# Patient Record
Sex: Female | Born: 1985
Health system: Southern US, Community
[De-identification: ages and names within clinical notes are randomized; demographics above are authoritative.]

## PROBLEM LIST (undated history)

## (undated) DIAGNOSIS — F419 Anxiety disorder, unspecified: Secondary | ICD-10-CM

## (undated) DIAGNOSIS — F1910 Other psychoactive substance abuse, uncomplicated: Secondary | ICD-10-CM

## (undated) DIAGNOSIS — M1990 Unspecified osteoarthritis, unspecified site: Secondary | ICD-10-CM

## (undated) DIAGNOSIS — F329 Major depressive disorder, single episode, unspecified: Secondary | ICD-10-CM

## (undated) DIAGNOSIS — F32A Depression, unspecified: Secondary | ICD-10-CM

## (undated) HISTORY — DX: Other psychoactive substance abuse, uncomplicated: F19.10

## (undated) HISTORY — DX: Anxiety disorder, unspecified: F41.9

## (undated) HISTORY — DX: Unspecified osteoarthritis, unspecified site: M19.90

## (undated) HISTORY — PX: MOUTH SURGERY: SHX715

## (undated) HISTORY — DX: Depression, unspecified: F32.A

## (undated) HISTORY — DX: Major depressive disorder, single episode, unspecified: F32.9

---
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
## 2003-09-24 ENCOUNTER — Emergency Department (HOSPITAL_COMMUNITY): Admission: EM | Admit: 2003-09-24 | Discharge: 2003-09-24 | Payer: Self-pay | Admitting: Emergency Medicine

## 2003-10-04 ENCOUNTER — Encounter: Admission: RE | Admit: 2003-10-04 | Discharge: 2003-11-02 | Payer: Self-pay | Admitting: Family Medicine

## 2005-05-04 ENCOUNTER — Emergency Department (HOSPITAL_COMMUNITY): Admission: EM | Admit: 2005-05-04 | Discharge: 2005-05-04 | Payer: Self-pay | Admitting: Emergency Medicine

## 2006-12-23 ENCOUNTER — Inpatient Hospital Stay (HOSPITAL_COMMUNITY): Admission: AD | Admit: 2006-12-23 | Discharge: 2006-12-23 | Payer: Self-pay | Admitting: Obstetrics and Gynecology

## 2007-02-14 ENCOUNTER — Inpatient Hospital Stay (HOSPITAL_COMMUNITY): Admission: AD | Admit: 2007-02-14 | Discharge: 2007-02-14 | Payer: Self-pay | Admitting: Obstetrics and Gynecology

## 2007-03-21 ENCOUNTER — Inpatient Hospital Stay (HOSPITAL_COMMUNITY): Admission: RE | Admit: 2007-03-21 | Discharge: 2007-03-24 | Payer: Self-pay | Admitting: Obstetrics and Gynecology

## 2008-01-05 ENCOUNTER — Encounter: Admission: RE | Admit: 2008-01-05 | Discharge: 2008-04-04 | Payer: Self-pay | Admitting: Family Medicine

## 2010-12-19 NOTE — Op Note (Signed)
Angela Schwartz, Angela Schwartz               ACCOUNT NO.:  [id number]   MEDICAL RECORD NO.:  [id number]          PATIENT TYPE:  INP   LOCATION:  9128                          FACILITY:  [facility]   PHYSICIAN:  Zenaida Niece, M.D.DATE OF BIRTH:  [birth year]   DATE OF PROCEDURE:  03/21/2007  DATE OF DISCHARGE:                               OPERATIVE REPORT   PREOPERATIVE DIAGNOSIS:  Intrauterine pregnancy at 40 weeks and arrest  of dilation.   POSTOPERATIVE DIAGNOSIS:  Intrauterine pregnancy at 40 weeks and arrest  of dilation.   PROCEDURE:  Primary low transverse cesarean section without extensions.   SURGEON:  Zenaida Niece, M.D.   ANESTHESIA:  Epidural.   SPECIMENS:  Placenta sent to pathology.   ESTIMATED BLOOD LOSS:  800 mL.   COMPLICATIONS:  None.   FINDINGS:  The patient had normal gravid anatomy and delivered a viable  female infant with Apgar's of 9 and 9 that weighed 8 pounds, 3 ounces.   PROCEDURE IN DETAIL:  The patient was taken to the operating room and  placed in the dorsal supine position with left lateral tilt.  Her  previously placed epidural was dosed appropriately.  The abdomen was  prepped and draped in the usual sterile fashion.  The level of her  anesthesia was found to be adequate and the abdomen was entered via  standard Pfannenstiel incision.  A disposable self-retaining retractor  was placed and the lower uterine segment was well exposed.  A 4 cm  transverse incision was made in the lower uterine segment.  Once the  amniotic cavity was entered, the incision was extended bilaterally  digitally.  The fetal vertex was found to be in the direct OP position.  It was delivered through the incision atraumatically.  Mouth and nares  were suctioned and the remainder of the infant delivered atraumatically.  Cord was doubly clamped and cut, and the infant handed to the awaiting  pediatric team.  Placenta then delivered spontaneously and was sent  possibly for cord  blood collection and then to pathology.  The uterus  was wiped dry with a clean lap pad all clots and debris removed.  Uterine incision was inspected and found to be free of extensions.  Uterine incision was then closed in two layers with the first layer  being a running locking layer with #1 chromic and the second layer being  an imbricating layer also with #1 chromic.  Bleeding from serosal edges  was controlled with electrocautery and two extra figure-of-eight sutures  of #1 chromic were used for hemostasis.  Tubes and ovaries were  inspected and found to be normal.  Subfascial space was then irrigated  and made hemostatic with electrocautery.  Fascia was closed in running  fashion starting at both ends and meeting in the middle with 0 Vicryl.  Subcutaneous tissue was irrigated and made hemostatic with  electrocautery.  This was then  closed with running 2-0 plain gut suture.  Skin was closed with staples  followed by a sterile dressing.  The patient tolerated the procedure  well and was taken to the  recovery room in stable condition.  Counts  were correct and she received Ancef 1 gram IV after cord clamp.      Zenaida Niece, M.D.  Electronically Signed     TDM/MEDQ  D:  03/21/2007  T:  03/23/2007  Job:  [id number]

## 2010-12-19 NOTE — Discharge Summary (Signed)
Angela Schwartz, Angela Schwartz               ACCOUNT NO.:  [id number]   MEDICAL RECORD NO.:  [id number]          PATIENT TYPE:  INP   LOCATION:  9128                          FACILITY:  [facility]   PHYSICIAN:  Zenaida Niece, M.D.DATE OF BIRTH:  27-May-1986   DATE OF ADMISSION:  03/21/2007  DATE OF DISCHARGE:  03/24/2007                               DISCHARGE SUMMARY   ADMISSION DIAGNOSES:  1. Intrauterine pregnancy at 40+ weeks.  2. Group B strep carrier.   DISCHARGE DIAGNOSES:  1. Intrauterine pregnancy at 40+ weeks.  2. Group B strep carrier.  3. Arrest of dilation.   PROCEDURES:  On August 15, she had a primary low transverse cesarean  section.   HISTORY AND PHYSICAL:  This is a 25 year old black female gravida 2,  para 0-0-1-0 with an EGA of 40+ weeks who presents for induction with a  favorable cervix.  Prenatal care essentially uncomplicated.   PRENATAL LABS:  Blood type is O negative with a negative antibody  screen, RPR nonreactive, rubella immune, hepatitis B surface antigen  negative, HIV negative, gonorrhea and chlamydia negative, 1-hour Glucola  96, group B strep is positive.   PAST OB HISTORY:  One elective termination.   GYN HISTORY:  Remote history of chlamydia.   PAST MEDICAL HISTORY:  Of hypertension and asthma.   PHYSICAL EXAM:  VITAL SIGNS:  She is afebrile with stable vital signs.  Fetal heart tracing reactive with rare contractions.  ABDOMEN:  Gravid, nontender with an estimated fetal weight of 8 pounds.  PELVIC:  Cervix is 3+, 50, -2 vertex presentation, adequate pelvis and  amniotomy reveals clear fluid to possibly light meconium.   HOSPITAL COURSE:  The patient was admitted and had amniotomy done for  induction and was also started on penicillin for group B strep  prophylaxis.  She then required Pitocin and entered active labor.  She  had a protracted course and had an IUPC placed.  She progressed to 5-6  cm but did not make it past that despite adequate  contractions and  several hours.  Thus on the evening of August 15 she underwent a primary  low transverse cesarean section under epidural anesthesia.  She had  normal anatomy delivered a viable female infant with Apgars of nine and  nine, weight 8 pounds 3 ounces.  Estimated blood loss was 800 mL.  Postoperatively she had no significant complications.  She remained  afebrile.  Preoperative hemoglobin 12.2, postoperative 10.4.  On the  morning postoperative #3 she was felt to be stable enough for discharge  home.  Her incision was healing well and staples were to be left intact.   DISCHARGE INSTRUCTIONS:  Regular diet, pelvic rest, no strenuous  activity.  Follow-up is in 2-3 days for staple removal.  Medications are  Percocet #30 one to two p.o. q.4-6 hours p.r.n. pain and over-the-  counter counter ibuprofen as needed and she is given our discharge  pamphlet.      Zenaida Niece, M.D.  Electronically Signed     TDM/MEDQ  D:  03/24/2007  T:  03/24/2007  Job:  [id number] 

## 2011-05-18 LAB — CBC
HCT: 30 — ABNORMAL LOW
HCT: 36
Hemoglobin: 10.4 — ABNORMAL LOW
Hemoglobin: 12.2
MCHC: 33.9
MCHC: 34.5
MCV: 92.3
MCV: 93
Platelets: 207
Platelets: 245
RBC: 3.26 — ABNORMAL LOW
RBC: 3.87
RDW: 13.7
RDW: 14.2 — ABNORMAL HIGH
WBC: 12.3 — ABNORMAL HIGH
WBC: 16.1 — ABNORMAL HIGH

## 2011-05-18 LAB — RPR: RPR Ser Ql: NONREACTIVE

## 2013-05-12 ENCOUNTER — Encounter (HOSPITAL_COMMUNITY): Payer: Self-pay | Admitting: Emergency Medicine

## 2013-05-12 ENCOUNTER — Emergency Department (HOSPITAL_COMMUNITY)
Admission: EM | Admit: 2013-05-12 | Discharge: 2013-05-12 | Disposition: A | Discharge status: Home / Self Care | Payer: Self-pay | Attending: Emergency Medicine | Admitting: Emergency Medicine

## 2013-05-12 DIAGNOSIS — R220 Localized swelling, mass and lump, head: Secondary | ICD-10-CM | POA: Insufficient documentation

## 2013-05-12 DIAGNOSIS — K047 Periapical abscess without sinus: Secondary | ICD-10-CM | POA: Insufficient documentation

## 2013-05-12 DIAGNOSIS — K029 Dental caries, unspecified: Secondary | ICD-10-CM | POA: Insufficient documentation

## 2013-05-12 DIAGNOSIS — F17200 Nicotine dependence, unspecified, uncomplicated: Secondary | ICD-10-CM | POA: Insufficient documentation

## 2013-05-12 DIAGNOSIS — R51 Headache: Secondary | ICD-10-CM | POA: Insufficient documentation

## 2013-05-12 MED ORDER — PENICILLIN V POTASSIUM 500 MG PO TABS: 500.0000 mg | ORAL_TABLET | Freq: Three times a day (TID) | ORAL | Status: DC

## 2013-05-12 MED ORDER — PENICILLIN V POTASSIUM 500 MG PO TABS
500.0000 mg | ORAL_TABLET | Freq: Once | ORAL | Status: AC
  Administered 2013-05-12: 500 mg via ORAL
  Filled 2013-05-12: qty 1

## 2013-05-12 MED ORDER — IBUPROFEN 600 MG PO TABS: 600.0000 mg | ORAL_TABLET | Freq: Four times a day (QID) | ORAL | Status: DC | PRN

## 2013-05-12 MED ORDER — HYDROCODONE-ACETAMINOPHEN 5-325 MG PO TABS: ORAL_TABLET | ORAL | Status: DC

## 2013-05-12 NOTE — ED Provider Notes (Signed)
Medical screening examination/treatment/procedure(s) were performed by non-physician practitioner and as supervising physician I was immediately available for consultation/collaboration.  Maalle Starrett T Vernessa Likes, MD 05/12/13 2325 

## 2013-05-12 NOTE — ED Notes (Signed)
Pt c/o dental pain and jaw swelling x2 days.

## 2013-05-12 NOTE — ED Provider Notes (Signed)
CSN: [id number]     Arrival date & time 05/12/13  1855 History   First MD Initiated Contact with Patient 05/12/13 1926    This chart was scribed for Angela Schwartz, a non-physician practitioner working with Toy Baker, MD by Lewanda Rife, ED Scribe. This patient was seen in room WTR9/WTR9 and the patient's care was started at 7:33 PM     Chief Complaint  Patient presents with  . Dental Pain   (Consider location/radiation/quality/duration/timing/severity/associated sxs/prior Treatment) The history is provided by the patient. No language interpreter was used.   HPI Comments: Angela Schwartz is a 27 y.o. female who presents to the Emergency Department complaining of constant worsening right lower quadrant dental pain radiating to R ear onset 2 days. Reports associated mild headache, and gingival swelling. Reports symptoms are exacerbated with eating and mildly alleviated by ibuprofen and tylenol. Denies associated fever, swelling in neck, and dysphagia.   History reviewed. No pertinent past medical history. Past Surgical History  Procedure Laterality Date  . Mouth surgery    . Cesarean section     History reviewed. No pertinent family history. History  Substance Use Topics  . Smoking status: Current Every Day Smoker  . Smokeless tobacco: Not on file  . Alcohol Use: Yes   OB History   Grav Para Term Preterm Abortions TAB SAB Ect Mult Living                 Review of Systems  Constitutional: Negative for fever.  HENT: Positive for facial swelling and dental problem. Negative for ear pain, sore throat, trouble swallowing and neck pain.   Respiratory: Negative for shortness of breath and stridor.   Skin: Negative for color change.  Neurological: Negative for headaches.       Allergies  Review of patient's allergies indicates no known allergies.  Home Medications   Current Outpatient Rx  Name  Route  Sig  Dispense  Refill  . acetaminophen (TYLENOL) 500 MG  tablet   Oral   Take 500 mg by mouth every 6 (six) hours as needed for pain.         Marland Kitchen ibuprofen (ADVIL,MOTRIN) 200 MG tablet   Oral   Take 200 mg by mouth every 6 (six) hours as needed for pain.         Marland Kitchen levonorgestrel (MIRENA) 20 MCG/24HR IUD   Intrauterine   1 each by Intrauterine route once.          LMP 04/20/2013 Physical Exam  Nursing note and vitals reviewed. Constitutional: She appears well-developed and well-nourished. No distress.  HENT:  Head: Normocephalic and atraumatic.  Right Ear: Tympanic membrane, external ear and ear canal normal.  Left Ear: Tympanic membrane, external ear and ear canal normal.  Nose: Nose normal.  Mouth/Throat: Uvula is midline, oropharynx is clear and moist and mucous membranes are normal. No trismus in the jaw. Abnormal dentition. Dental caries present. No dental abscesses or edematous. No oropharyngeal exudate, posterior oropharyngeal edema, posterior oropharyngeal erythema or tonsillar abscesses.    TTP of gumline in area of right lower 1st molar, associated swelling, no gross abscess  Eyes: Conjunctivae and EOM are normal.  Neck: Normal range of motion. Neck supple. No tracheal deviation present.  No neck swelling or Ludwig's angina  Cardiovascular: Normal rate.   Pulmonary/Chest: Effort normal. No respiratory distress.  Musculoskeletal: Normal range of motion.  Lymphadenopathy:    She has no cervical adenopathy.  Neurological: She is alert.  Skin:  Skin is warm and dry.  Psychiatric: She has a normal mood and affect. Her behavior is normal.    ED Course  Procedures (including critical care time)  COORDINATION OF CARE:  Nursing notes reviewed. Vital signs reviewed. Initial pt interview and examination performed.  Treatment plan initiated: Medications  penicillin v potassium (VEETID) tablet 500 mg (500 mg Oral Given 05/12/13 1950)   Initial diagnostic testing ordered.    7:34 PM-Discussed treatment plan with pt at  bedside, which includes treatment with antibiotic and pt agrees with plan to follow up with referred dentist. Pt informed of return precautions and is comfortable with discharge at this time.   Labs Review Labs Reviewed - No data to display Imaging Review No results found.   Vital signs reviewed and are as follows: Filed Vitals:   05/12/13 1949  BP: 131/83  Pulse: 76  Temp: 98.8 F (37.1 C)  Resp: 18    Patient counseled to take prescribed medications as directed, return with worsening facial or neck swelling, and to follow-up with their dentist as soon as possible.    MDM   1. Periapical abscess    Patient with toothache.  No gross abscess that I feet could be drained at this time, but facial swelling present.  Exam unconcerning for Ludwig's angina or other deep tissue infection in neck.  Will treat with penicillin and pain medicine.  Urged patient to follow-up with dentist.     I personally performed the services described in this documentation, which was scribed in my presence. The recorded information has been reviewed and is accurate.    Renne Crigler, PA-C 05/12/13 2028

## 2017-05-30 DIAGNOSIS — H538 Other visual disturbances: Secondary | ICD-10-CM | POA: Diagnosis not present

## 2017-05-30 DIAGNOSIS — Z136 Encounter for screening for cardiovascular disorders: Secondary | ICD-10-CM | POA: Diagnosis not present

## 2017-05-30 DIAGNOSIS — E785 Hyperlipidemia, unspecified: Secondary | ICD-10-CM | POA: Diagnosis not present

## 2017-05-30 DIAGNOSIS — Z131 Encounter for screening for diabetes mellitus: Secondary | ICD-10-CM | POA: Diagnosis not present

## 2017-05-30 DIAGNOSIS — Z01118 Encounter for examination of ears and hearing with other abnormal findings: Secondary | ICD-10-CM | POA: Diagnosis not present

## 2017-05-30 DIAGNOSIS — Z0000 Encounter for general adult medical examination without abnormal findings: Secondary | ICD-10-CM | POA: Diagnosis not present

## 2017-05-30 DIAGNOSIS — E559 Vitamin D deficiency, unspecified: Secondary | ICD-10-CM | POA: Diagnosis not present

## 2017-05-30 DIAGNOSIS — Z720 Tobacco use: Secondary | ICD-10-CM | POA: Diagnosis not present

## 2017-07-03 DIAGNOSIS — E785 Hyperlipidemia, unspecified: Secondary | ICD-10-CM | POA: Diagnosis not present

## 2017-07-03 DIAGNOSIS — Z124 Encounter for screening for malignant neoplasm of cervix: Secondary | ICD-10-CM | POA: Diagnosis not present

## 2017-07-03 DIAGNOSIS — N760 Acute vaginitis: Secondary | ICD-10-CM | POA: Diagnosis not present

## 2017-07-03 DIAGNOSIS — E559 Vitamin D deficiency, unspecified: Secondary | ICD-10-CM | POA: Diagnosis not present

## 2017-07-03 DIAGNOSIS — Z720 Tobacco use: Secondary | ICD-10-CM | POA: Diagnosis not present

## 2017-07-10 DIAGNOSIS — Z720 Tobacco use: Secondary | ICD-10-CM | POA: Diagnosis not present

## 2017-07-10 DIAGNOSIS — Z124 Encounter for screening for malignant neoplasm of cervix: Secondary | ICD-10-CM | POA: Diagnosis not present

## 2017-07-10 DIAGNOSIS — E785 Hyperlipidemia, unspecified: Secondary | ICD-10-CM | POA: Diagnosis not present

## 2017-07-10 LAB — HM PAP SMEAR: HM PAP: NORMAL

## 2017-09-02 DIAGNOSIS — N898 Other specified noninflammatory disorders of vagina: Secondary | ICD-10-CM | POA: Diagnosis not present

## 2017-09-03 ENCOUNTER — Encounter (HOSPITAL_COMMUNITY): Payer: Self-pay

## 2017-09-03 ENCOUNTER — Emergency Department (HOSPITAL_COMMUNITY)
Admission: EM | Admit: 2017-09-03 | Discharge: 2017-09-03 | Disposition: A | Discharge status: Home / Self Care | Payer: No Typology Code available for payment source | Attending: Emergency Medicine | Admitting: Emergency Medicine

## 2017-09-03 ENCOUNTER — Other Ambulatory Visit: Payer: Self-pay

## 2017-09-03 DIAGNOSIS — F17210 Nicotine dependence, cigarettes, uncomplicated: Secondary | ICD-10-CM | POA: Insufficient documentation

## 2017-09-03 DIAGNOSIS — M5489 Other dorsalgia: Secondary | ICD-10-CM | POA: Diagnosis not present

## 2017-09-03 DIAGNOSIS — M546 Pain in thoracic spine: Secondary | ICD-10-CM | POA: Insufficient documentation

## 2017-09-03 DIAGNOSIS — T148XXA Other injury of unspecified body region, initial encounter: Secondary | ICD-10-CM | POA: Diagnosis not present

## 2017-09-03 MED ORDER — ACETAMINOPHEN 325 MG PO TABS
650.0000 mg | ORAL_TABLET | Freq: Once | ORAL | Status: AC
  Administered 2017-09-03: 650 mg via ORAL
  Filled 2017-09-03: qty 2

## 2017-09-03 MED ORDER — CYCLOBENZAPRINE HCL 10 MG PO TABS: 10.0000 mg | ORAL_TABLET | Freq: Two times a day (BID) | ORAL | 0 refills | Status: DC | PRN
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
## 2017-09-03 NOTE — Discharge Instructions (Addendum)
Please read instructions below.  Apply ice to your back for 20 minutes at a time. You can take advil/ibuprofen every 6 hours as needed for pain. You can take flexeril at bedtime as needed for muscle spasm. Talk with your PCP about any new medications.  Return to ER for severe headache or vision changes, if new numbness or tingling in your arms or legs, inability to urinate, inability to hold your bowels, or weakness in your extremities.
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
## 2017-09-03 NOTE — ED Notes (Signed)
Patient verbalizes understanding of discharge instructions. Opportunity for questioning and answers were provided. Armband removed by staff, pt discharged from ED ambulatory.   

## 2017-10-08 ENCOUNTER — Ambulatory Visit: Payer: Self-pay | Admitting: Nurse Practitioner

## 2017-10-15 ENCOUNTER — Ambulatory Visit: Payer: BLUE CROSS/BLUE SHIELD | Admitting: Nurse Practitioner

## 2017-10-15 ENCOUNTER — Encounter: Payer: Self-pay | Admitting: Nurse Practitioner

## 2017-10-15 VITALS — BP 134/88 | HR 87 | Temp 98.2°F | Ht 65.0 in | Wt 215.0 lb

## 2017-10-15 DIAGNOSIS — F4323 Adjustment disorder with mixed anxiety and depressed mood: Secondary | ICD-10-CM | POA: Diagnosis not present

## 2017-10-15 DIAGNOSIS — F322 Major depressive disorder, single episode, severe without psychotic features: Secondary | ICD-10-CM | POA: Insufficient documentation

## 2017-10-15 DIAGNOSIS — M25562 Pain in left knee: Secondary | ICD-10-CM | POA: Diagnosis not present

## 2017-10-15 MED ORDER — DICLOFENAC SODIUM 2 % TD SOLN: 1.0000 "application " | Freq: Two times a day (BID) | TRANSDERMAL | 0 refills | Status: DC

## 2017-10-15 NOTE — Patient Instructions (Signed)
You will be contacted to schedule appt with psychology.  Do knee exercise once a day. Call office for referral to sports medicine if no improvement in 2weeks. Use Knee sleeve daily. Remove knee sleeve during exercise. May also use cold compress as needed  Knee Pain, Adult Many things can cause knee pain. The pain often goes away on its own with time and rest. If the pain does not go away, tests may be done to find out what is causing the pain. Follow these instructions at home: Activity  Rest your knee.  Do not do things that cause pain.  Avoid activities where both feet leave the ground at the same time (high-impact activities). Examples are running, jumping rope, and doing jumping jacks. General instructions  Take medicines only as told by your doctor.  Raise (elevate) your knee when you are resting. Make sure your knee is higher than your heart.  Sleep with a pillow under your knee.  If told, put ice on the knee: ? Put ice in a plastic bag. ? Place a towel between your skin and the bag. ? Leave the ice on for 20 minutes, 2-3 times a day.  Ask your doctor if you should wear an elastic knee support.  Lose weight if you are overweight. Being overweight can make your knee hurt more.  Do not use any tobacco products. These include cigarettes, chewing tobacco, or electronic cigarettes. If you need help quitting, ask your doctor. Smoking may slow down healing. Contact a doctor if:  The pain does not stop.  The pain changes or gets worse.  You have a fever along with knee pain.  Your knee gives out or locks up.  Your knee swells, and becomes worse. Get help right away if:  Your knee feels warm.  You cannot move your knee.  You have very bad knee pain.  You have chest pain.  You have trouble breathing. Summary  Many things can cause knee pain. The pain often goes away on its own with time and rest.  Avoid activities that put stress on your knee. These include  running and jumping rope.  Get help right away if you cannot move your knee, or if your knee feels warm, or if you have trouble breathing. This information is not intended to replace advice given to you by your health care provider. Make sure you discuss any questions you have with your health care provider. Document Released: 10/19/2008 Document Revised: 07/17/2016 Document Reviewed: 07/17/2016 Elsevier Interactive Patient Education  2017 [employer]   Knee Exercises Ask your health care provider which exercises are safe for you. Do exercises exactly as told by your health care provider and adjust them as directed. It is normal to feel mild stretching, pulling, tightness, or discomfort as you do these exercises, but you should stop right away if you feel sudden pain or your pain gets worse.Do not begin these exercises until told by your health care provider. STRETCHING AND RANGE OF MOTION EXERCISES These exercises warm up your muscles and joints and improve the movement and flexibility of your knee. These exercises also help to relieve pain, numbness, and tingling. Exercise A: Knee Extension, Prone Lie on your abdomen on a bed. Place your left / right knee just beyond the edge of the surface so your knee is not on the bed. You can put a towel under your left / right thigh just above your knee for comfort. Relax your leg muscles and allow gravity to straighten  your knee. You should feel a stretch behind your left / right knee. Hold this position for __________ seconds. Scoot up so your knee is supported between repetitions. Repeat __________ times. Complete this stretch __________ times a day. Exercise B: Knee Flexion, Active  Lie on your back with both knees straight. If this causes back discomfort, bend your left / right knee so your foot is flat on the floor. Slowly slide your left / right heel back toward your buttocks until you feel a gentle stretch in the front of your knee or  thigh. Hold this position for __________ seconds. Slowly slide your left / right heel back to the starting position. Repeat __________ times. Complete this exercise __________ times a day. Exercise C: Quadriceps, Prone  Lie on your abdomen on a firm surface, such as a bed or padded floor. Bend your left / right knee and hold your ankle. If you cannot reach your ankle or pant leg, loop a belt around your foot and grab the belt instead. Gently pull your heel toward your buttocks. Your knee should not slide out to the side. You should feel a stretch in the front of your thigh and knee. Hold this position for __________ seconds. Repeat __________ times. Complete this stretch __________ times a day. Exercise D: Hamstring, Supine Lie on your back. Loop a belt or towel over the ball of your left / right foot. The ball of your foot is on the walking surface, right under your toes. Straighten your left / right knee and slowly pull on the belt to raise your leg until you feel a gentle stretch behind your knee. Do not let your left / right knee bend while you do this. Keep your other leg flat on the floor. Hold this position for __________ seconds. Repeat __________ times. Complete this stretch __________ times a day. STRENGTHENING EXERCISES These exercises build strength and endurance in your knee. Endurance is the ability to use your muscles for a long time, even after they get tired. Exercise E: Quadriceps, Isometric  Lie on your back with your left / right leg extended and your other knee bent. Put a rolled towel or small pillow under your knee if told by your health care provider. Slowly tense the muscles in the front of your left / right thigh. You should see your kneecap slide up toward your hip or see increased dimpling just above the knee. This motion will push the back of the knee toward the floor. For __________ seconds, keep the muscle as tight as you can without increasing your  pain. Relax the muscles slowly and completely. Repeat __________ times. Complete this exercise __________ times a day. Exercise F: Straight Leg Raises - Quadriceps Lie on your back with your left / right leg extended and your other knee bent. Tense the muscles in the front of your left / right thigh. You should see your kneecap slide up or see increased dimpling just above the knee. Your thigh may even shake a bit. Keep these muscles tight as you raise your leg 4-6 inches (10-15 cm) off the floor. Do not let your knee bend. Hold this position for __________ seconds. Keep these muscles tense as you lower your leg. Relax your muscles slowly and completely after each repetition. Repeat __________ times. Complete this exercise __________ times a day. Exercise G: Hamstring, Isometric Lie on your back on a firm surface. Bend your left / right knee approximately __________ degrees. Dig your left / right heel into  the surface as if you are trying to pull it toward your buttocks. Tighten the muscles in the back of your thighs to dig as hard as you can without increasing any pain. Hold this position for __________ seconds. Release the tension gradually and allow your muscles to relax completely for __________ seconds after each repetition. Repeat __________ times. Complete this exercise __________ times a day. Exercise H: Hamstring Curls  If told by your health care provider, do this exercise while wearing ankle weights. Begin with __________ weights. Then increase the weight by 1 lb (0.5 kg) increments. Do not wear ankle weights that are more than __________. Lie on your abdomen with your legs straight. Bend your left / right knee as far as you can without feeling pain. Keep your hips flat against the floor. Hold this position for __________ seconds. Slowly lower your leg to the starting position.  Repeat __________ times. Complete this exercise __________ times a day. Exercise I: Squats  (Quadriceps) Stand in front of a table, with your feet and knees pointing straight ahead. You may rest your hands on the table for balance but not for support. Slowly bend your knees and lower your hips like you are going to sit in a chair. Keep your weight over your heels, not over your toes. Keep your lower legs upright so they are parallel with the table legs. Do not let your hips go lower than your knees. Do not bend lower than told by your health care provider. If your knee pain increases, do not bend as low. Hold the squat position for __________ seconds. Slowly push with your legs to return to standing. Do not use your hands to pull yourself to standing. Repeat __________ times. Complete this exercise __________ times a day. Exercise J: Wall Slides (Quadriceps)  Lean your back against a smooth wall or door while you walk your feet out 18-24 inches (46-61 cm) from it. Place your feet hip-width apart. Slowly slide down the wall or door until your knees bend __________ degrees. Keep your knees over your heels, not over your toes. Keep your knees in line with your hips. Hold for __________ seconds. Repeat __________ times. Complete this exercise __________ times a day. Exercise K: Straight Leg Raises - Hip Abductors Lie on your side with your left / right leg in the top position. Lie so your head, shoulder, knee, and hip line up. You may bend your bottom knee to help you keep your balance. Roll your hips slightly forward so your hips are stacked directly over each other and your left / right knee is facing forward. Leading with your heel, lift your top leg 4-6 inches (10-15 cm). You should feel the muscles in your outer hip lifting. Do not let your foot drift forward. Do not let your knee roll toward the ceiling. Hold this position for __________ seconds. Slowly return your leg to the starting position. Let your muscles relax completely after each repetition. Repeat __________ times.  Complete this exercise __________ times a day. Exercise L: Straight Leg Raises - Hip Extensors Lie on your abdomen on a firm surface. You can put a pillow under your hips if that is more comfortable. Tense the muscles in your buttocks and lift your left / right leg about 4-6 inches (10-15 cm). Keep your knee straight as you lift your leg. Hold this position for __________ seconds. Slowly lower your leg to the starting position. Let your leg relax completely after each repetition. Repeat __________ times. Complete  this exercise __________ times a day. This information is not intended to replace advice given to you by your health care provider. Make sure you discuss any questions you have with your health care provider. Document Released: 06/06/2005 Document Revised: 04/16/2016 Document Reviewed: 05/29/2015 Elsevier Interactive Patient Education  2018 [employer].
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
## 2017-11-20 ENCOUNTER — Ambulatory Visit: Payer: Self-pay | Admitting: Psychology

## 2017-11-26 ENCOUNTER — Encounter: Payer: Self-pay | Admitting: Nurse Practitioner

## 2017-11-26 ENCOUNTER — Ambulatory Visit: Payer: BLUE CROSS/BLUE SHIELD | Admitting: Nurse Practitioner

## 2017-11-26 ENCOUNTER — Telehealth: Payer: Self-pay | Admitting: Nurse Practitioner

## 2017-11-26 VITALS — BP 134/84 | HR 93 | Temp 98.7°F | Ht 65.0 in | Wt 211.6 lb

## 2017-11-26 DIAGNOSIS — L209 Atopic dermatitis, unspecified: Secondary | ICD-10-CM

## 2017-11-26 DIAGNOSIS — R21 Rash and other nonspecific skin eruption: Secondary | ICD-10-CM

## 2017-11-26 MED ORDER — KETOCONAZOLE 2 % EX SHAM: 1.0000 "application " | MEDICATED_SHAMPOO | CUTANEOUS | 0 refills | Status: DC

## 2017-11-26 MED ORDER — HYDROCORTISONE ACETATE 2.5 % EX CREA: 1.0000 "application " | TOPICAL_CREAM | Freq: Two times a day (BID) | CUTANEOUS | 2 refills | Status: DC | PRN

## 2017-11-26 NOTE — Telephone Encounter (Signed)
Copied from CRM [id number]. Topic: Quick Communication - Rx Refill/Question >> Nov 26, 2017  2:58 PM Jonette EvaBarksdale, Harvey B wrote: Pharmacy called to state the medication below requires PA they would like to prescribe the regular hydrocortisone so that the pt wont have to go thru a PA contact pharmacy if needed   Medication: Hydrocortisone Acetate 2.5 % CREA [[id number]][[id number]]  Has the patient contacted their pharmacy? Yes.   (Agent: If no, request that the patient contact the pharmacy for the refill.) Preferred Pharmacy (with phone number or street name): [facility] Agent: Please be advised that RX refills may take up to 3 business days. We ask that you follow-up with your pharmacy.

## 2017-11-26 NOTE — Patient Instructions (Signed)
May use extra virgin oil to mosturize scalp 2-3times a week.  May use prenatal multivitamin 1tab once a day with food to promote hair growth.  If no improvement in 41month, will refer to dermatology.  Atopic Dermatitis Atopic dermatitis is a skin disorder that causes inflammation of the skin. This is the most common type of eczema. Eczema is a group of skin conditions that cause the skin to be itchy, red, and swollen. This condition is generally worse during the cooler winter months and often improves during the warm summer months. Symptoms can vary from person to person. Atopic dermatitis usually starts showing signs in infancy and can last through adulthood. This condition cannot be passed from one person to another (non-contagious), but it is more common in families. Atopic dermatitis may not always be present. When it is present, it is called a flare-up. What are the causes? The exact cause of this condition is not known. Flare-ups of the condition may be triggered by:  Contact with something that you are sensitive or allergic to.  Stress.  Certain foods.  Extremely hot or cold weather.  Harsh chemicals and soaps.  Dry air.  Chlorine.  What increases the risk? This condition is more likely to develop in people who have a personal history or family history of eczema, allergies, asthma, or hay fever. What are the signs or symptoms? Symptoms of this condition include:  Dry, scaly skin.  Red, itchy rash.  Itchiness, which can be severe. This may occur before the skin rash. This can make sleeping difficult.  Skin thickening and cracking that can occur over time.  How is this diagnosed? This condition is diagnosed based on your symptoms, a medical history, and a physical exam. How is this treated? There is no cure for this condition, but symptoms can usually be controlled. Treatment focuses on:  Controlling the itchiness and scratching. You may be given medicines, such as  antihistamines or steroid creams.  Limiting exposure to things that you are sensitive or allergic to (allergens).  Recognizing situations that cause stress and developing a plan to manage stress.  If your atopic dermatitis does not get better with medicines, or if it is all over your body (widespread), a treatment using a specific type of light (phototherapy) may be used. Follow these instructions at home: Skin care  Keep your skin well-moisturized. Doing this seals in moisture and helps to prevent dryness. ? Use unscented lotions that have petroleum in them. ? Avoid lotions that contain alcohol or water. They can dry the skin.  Keep baths or showers short (less than 5 minutes) in warm water. Do not use hot water. ? Use mild, unscented cleansers for bathing. Avoid soap and bubble bath. ? Apply a moisturizer to your skin right after a bath or shower.  Do not apply anything to your skin without checking with your health care provider. General instructions  Dress in clothes made of cotton or cotton blends. Dress lightly because heat increases itchiness.  When washing your clothes, rinse your clothes twice so all of the soap is removed.  Avoid any triggers that can cause a flare-up.  Try to manage your stress.  Keep your fingernails cut short.  Avoid scratching. Scratching makes the rash and itchiness worse. It may also result in a skin infection (impetigo) due to a break in the skin caused by scratching.  Take or apply over-the-counter and prescription medicines only as told by your health care provider.  Keep all follow-up visits  as told by your health care provider. This is important.  Do not be around people who have cold sores or fever blisters. If you get the infection, it may cause your atopic dermatitis to worsen. Contact a health care provider if:  Your itchiness interferes with sleep.  Your rash gets worse or it is not better within one week of starting  treatment.  You have a fever.  You have a rash flare-up after having contact with someone who has cold sores or fever blisters. Get help right away if:  You develop pus or soft yellow scabs in the rash area. Summary  This condition causes a red rash and itchy, dry, scaly skin.  Treatment focuses on controlling the itchiness and scratching, limiting exposure to things that you are sensitive or allergic to (allergens), recognizing situations that cause stress, and developing a plan to manage stress.  Keep your skin well-moisturized.  Keep baths or showers shorter than 5 minutes and use warm water. Do not use hot water. This information is not intended to replace advice given to you by your health care provider. Make sure you discuss any questions you have with your health care provider. Document Released: 07/20/2000 Document Revised: 08/24/2016 Document Reviewed: 08/24/2016 Elsevier Interactive Patient Education  [employer]2018 [employer]

## 2017-11-26 NOTE — Progress Notes (Signed)
Subjective:  Patient ID: Angela Schwartz, female    DOB: 02/09/1986  Age: 32 y.o. MRN: [id number]  CC: Rash (rash started about a month ago on back and has moved its way up her back onto her scalp, starting to loose hair now bc of it.)  Rash  This is a recurrent problem. The current episode started more than 1 month ago. The problem has been waxing and waning since onset. The affected locations include the scalp, neck and back. The rash is characterized by dryness, itchiness and scaling. She was exposed to nothing. Pertinent negatives include no anorexia, congestion, cough, facial edema, fatigue, fever, joint pain, nail changes or sore throat. Past treatments include moisturizer. The treatment provided mild relief. Her past medical history is significant for eczema.   Outpatient Medications Prior to Visit  Medication Sig Dispense Refill  . acetaminophen (TYLENOL) 500 MG tablet Take 500 mg by mouth every 6 (six) hours as needed for pain.    . Diclofenac Sodium (PENNSAID) 2 % SOLN Place 1 application onto the skin 2 (two) times daily. 112 g 0  . levonorgestrel (MIRENA) 20 MCG/24HR IUD 1 each by Intrauterine route once.     No facility-administered medications prior to visit.     ROS See HPI  Objective:  BP 134/84 (BP Location: Left Arm, Patient Position: Sitting, Cuff Size: Normal)   Pulse 93   Temp 98.7 F (37.1 C) (Oral)   Ht 5\' 5"  (1.651 m)   Wt 211 lb 9.6 oz (96 kg)   SpO2 98%   BMI 35.21 kg/m   BP Readings from Last 3 Encounters:  11/26/17 134/84  10/15/17 134/88  09/03/17 140/83    Wt Readings from Last 3 Encounters:  11/26/17 211 lb 9.6 oz (96 kg)  10/15/17 215 lb (97.5 kg)  09/03/17 207 lb (93.9 kg)    Physical Exam  Constitutional: She is oriented to person, place, and time. No distress.  HENT:  Head:    Neck: Normal range of motion. Neck supple.  Cardiovascular: Normal rate.  Pulmonary/Chest: Effort normal.  Neurological: She is alert and oriented to  person, place, and time.  Skin: Rash noted. Rash is maculopapular.     Vitals reviewed.   Lab Results  Component Value Date   WBC 16.1 (H) 03/22/2007   HGB 10.4 (L) 03/22/2007   HCT 30.0 (L) 03/22/2007   PLT 207 03/22/2007    Assessment & Plan:   Angela Schwartz was seen today for rash.  Diagnoses and all orders for this visit:  Atopic dermatitis, unspecified type -     ketoconazole (NIZORAL) 2 % shampoo; Apply 1 application topically 2 (two) times a week. To scalp -     Hydrocortisone Acetate 2.5 % CREA; Apply 1 application topically every 12 (twelve) hours as needed.   I am having Angela Schwartz start on ketoconazole and Hydrocortisone Acetate. I am also having her maintain her acetaminophen, levonorgestrel, and Diclofenac Sodium.  Meds ordered this encounter  Medications  . ketoconazole (NIZORAL) 2 % shampoo    Sig: Apply 1 application topically 2 (two) times a week. To scalp    Dispense:  120 mL    Refill:  0    Order Specific Question:   Supervising Provider    Answer:   Dianne Dun [[id number]]  . Hydrocortisone Acetate 2.5 % CREA    Sig: Apply 1 application topically every 12 (twelve) hours as needed.    Dispense:  4 g  Refill:  2    Order Specific Question:   Supervising Provider    Answer:   Dianne DunARON, TALIA M [[id number]]    Follow-up: No follow-ups on file.  [name], NP

## 2017-11-27 ENCOUNTER — Encounter: Payer: Self-pay | Admitting: Nurse Practitioner

## 2017-11-27 MED ORDER — HYDROCORTISONE 2.5 % EX CREA: TOPICAL_CREAM | Freq: Two times a day (BID) | CUTANEOUS | 0 refills | Status: DC

## 2017-11-27 NOTE — Telephone Encounter (Signed)
Needs PA for Hydrocortisone Acetate 2.5% CREAM. Pharmacy is asking for it to be changed to the regular Hydrocortisone so the patient will not have to go through a PA each time it is refilled.   [facility] [id number] [place] [facility] [place] [place] [facility] [id number] [facility] [address] [phone] (Phone) [phone] (Fax)

## 2017-11-27 NOTE — Telephone Encounter (Signed)
Your help please.Marland KitchenMarland Kitchen

## 2017-11-27 NOTE — Addendum Note (Signed)
Addended by: Alysia PennaNCHE, Orson Rho L on: 11/27/2017 02:30 PM   Modules accepted: Orders

## 2017-12-19 DIAGNOSIS — N920 Excessive and frequent menstruation with regular cycle: Secondary | ICD-10-CM | POA: Diagnosis not present

## 2017-12-19 DIAGNOSIS — N760 Acute vaginitis: Secondary | ICD-10-CM | POA: Diagnosis not present

## 2017-12-31 ENCOUNTER — Ambulatory Visit: Payer: BLUE CROSS/BLUE SHIELD | Admitting: Family Medicine

## 2017-12-31 ENCOUNTER — Encounter: Payer: Self-pay | Admitting: Family Medicine

## 2017-12-31 VITALS — BP 130/90 | HR 71 | Temp 98.2°F

## 2017-12-31 DIAGNOSIS — B029 Zoster without complications: Secondary | ICD-10-CM

## 2017-12-31 MED ORDER — VALACYCLOVIR HCL 1 G PO TABS: 1000.0000 mg | ORAL_TABLET | Freq: Three times a day (TID) | ORAL | 0 refills | Status: AC

## 2017-12-31 MED ORDER — MUPIROCIN 2 % EX OINT: 1.0000 "application " | TOPICAL_OINTMENT | Freq: Two times a day (BID) | CUTANEOUS | 0 refills | Status: DC

## 2017-12-31 NOTE — Assessment & Plan Note (Signed)
Vesicular rash consistent with early shingles Rx for valtrex tid x7 days Call for any worsening symptoms

## 2017-12-31 NOTE — Patient Instructions (Signed)

## 2017-12-31 NOTE — Progress Notes (Signed)
Angela Schwartz - 32 y.o. female MRN [id number]  Date of birth: 04-24-1986  Subjective Chief Complaint  Patient presents with  . Rash    HPI Angela Schwartz is a 32 y.o. female here today for acute visit with complaint of rash on her back.  Reports that rash began a couple of days ago, started as itching and has become more painful over the past 2 hours.  States that anything touching area is painful.  Describes as burning pain.  Daughter noticed some drainage from area this morning.  She had similar symptoms several months ago that resolved spontaneously.  She denies fever, chills, other lesions/rash.    ROS: Completed and negative except as noted in HPI  No Known Allergies  Past Medical History:  Diagnosis Date  . Anxiety   . Arthritis   . Depression   . Substance abuse [facility])     Past Surgical History:  Procedure Laterality Date  . CESAREAN SECTION    . MOUTH SURGERY      Social History   Socioeconomic History  . Marital status: Single    Spouse name: Not on file  . Number of children: 1  . Years of education: Not on file  . Highest education level: Not on file  Occupational History  . 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  Social Needs  . Financial resource strain: Not on file  . Food insecurity:    Worry: Not on file    Inability: Not on file  . Transportation needs:    Medical: Not on file    Non-medical: Not on file  Tobacco Use  . Smoking status: Former Games developer  . Smokeless tobacco: Never Used  Substance and Sexual Activity  . Alcohol use: Yes    Comment: every weekend  . Drug use: Yes    Types: Marijuana    Comment: once in awhile  . Sexual activity: Yes    Birth control/protection: IUD    Comment: Mirena- inserted 2016  Lifestyle  . Physical activity:    Days per week: Not on file    Minutes per session: Not on file  . Stress: Not on file  Relationships  . Social connections:    Talks on phone: Not on file    Gets together: Not  on file    Attends religious service: Not on file    Active member of club or organization: Not on file    Attends meetings of clubs or organizations: Not on file    Relationship status: Not on file  Other Topics Concern  . Not on file  Social History Narrative  . Not on file    Family History  Problem Relation Age of Onset  . Hypertension Mother   . Hyperlipidemia Mother   . Hypertension Father   . Hyperlipidemia Father   . Heart failure Father   . Diabetes Father   . Hypertension Brother   . Cancer Maternal Aunt        cervical    Health Maintenance  Topic Date Due  . PAP SMEAR  02/22/2005  . INFLUENZA VACCINE  06/17/2018 (Originally 03/06/2018)  . HIV Screening  11/27/2018 (Originally 02/23/1999)  . TETANUS/TDAP  08/21/2023    ----------------------------------------------------------------------------------------------------------------------------------------------------------------------------------------------------------------- Physical Exam BP 130/90   Pulse 71   Temp 98.2 F (36.8 C)   Physical Exam  Constitutional: She is oriented to person, place, and time. She appears well-nourished. No distress.  HENT:  Head: Normocephalic and atraumatic.  Mouth/Throat: Oropharynx is clear and moist.  Eyes: No scleral icterus.  Cardiovascular: Normal rate, regular rhythm and normal heart sounds.  Neurological: She is alert and oriented to person, place, and time.  Skin:  Slightly indurated cluster of vesicles on upper right side of back.  Small amount of crusting but no drainage or fluctuance  Psychiatric: She has a normal mood and affect. Her behavior is normal.    ------------------------------------------------------------------------------------------------------------------------------------------------------------------------------------------------------------------- Assessment and Plan  Herpes zoster without complication Vesicular rash consistent with early  shingles Rx for valtrex tid x7 days Call for any worsening symptoms

## 2018-01-01 ENCOUNTER — Encounter: Payer: Self-pay | Admitting: Emergency Medicine

## 2018-01-15 ENCOUNTER — Ambulatory Visit: Payer: BLUE CROSS/BLUE SHIELD | Admitting: Nurse Practitioner

## 2018-02-04 ENCOUNTER — Encounter: Payer: Self-pay | Admitting: Nurse Practitioner

## 2018-02-04 ENCOUNTER — Ambulatory Visit: Payer: BLUE CROSS/BLUE SHIELD | Admitting: Nurse Practitioner

## 2018-02-04 VITALS — BP 134/84 | HR 88 | Temp 98.5°F | Ht 65.0 in | Wt 212.0 lb

## 2018-02-04 DIAGNOSIS — L209 Atopic dermatitis, unspecified: Secondary | ICD-10-CM

## 2018-02-04 DIAGNOSIS — F4323 Adjustment disorder with mixed anxiety and depressed mood: Secondary | ICD-10-CM

## 2018-02-04 MED ORDER — KETOCONAZOLE 2 % EX SHAM: 1.0000 "application " | MEDICATED_SHAMPOO | CUTANEOUS | 0 refills | Status: DC

## 2018-02-04 MED ORDER — HYDROCORTISONE 2.5 % EX CREA: TOPICAL_CREAM | Freq: Two times a day (BID) | CUTANEOUS | 0 refills | Status: DC

## 2018-02-04 NOTE — Patient Instructions (Addendum)
Please contact psychology to reschedule appt.  You may also reach out to another psychology on list provided if you are not able to get an appt with [facility] psychology.  Encourage staying active and heart healthy diet.  Living With Depression Everyone experiences occasional disappointment, sadness, and loss in their lives. When you are feeling down, blue, or sad for at least 2 weeks in a row, it may mean that you have depression. Depression can affect your thoughts and feelings, relationships, daily activities, and physical health. It is caused by changes in the way your brain functions. If you receive a diagnosis of depression, your health care provider will tell you which type of depression you have and what treatment options are available to you. If you are living with depression, there are ways to help you recover from it and also ways to prevent it from coming back. How to cope with lifestyle changes Coping with stress Stress is your body's reaction to life changes and events, both good and bad. Stressful situations may include:  Getting married.  The death of a spouse.  Losing a job.  Retiring.  Having a baby.  Stress can last just a few hours or it can be ongoing. Stress can play a major role in depression, so it is important to learn both how to cope with stress and how to think about it differently. Talk with your health care provider or a counselor if you would like to learn more about stress reduction. He or she may suggest some stress reduction techniques, such as:  Music therapy. This can include creating music or listening to music. Choose music that you enjoy and that inspires you.  Mindfulness-based meditation. This kind of meditation can be done while sitting or walking. It involves being aware of your normal breaths, rather than trying to control your breathing.  Centering prayer. This is a kind of meditation that involves focusing on a spiritual word or phrase. Choose a  word, phrase, or sacred image that is meaningful to you and that brings you peace.  Deep breathing. To do this, expand your stomach and inhale slowly through your nose. Hold your breath for 3-5 seconds, then exhale slowly, allowing your stomach muscles to relax.  Muscle relaxation. This involves intentionally tensing muscles then relaxing them.  Choose a stress reduction technique that fits your lifestyle and personality. Stress reduction techniques take time and practice to develop. Set aside 5-15 minutes a day to do them. Therapists can offer training in these techniques. The training may be covered by some insurance plans. Other things you can do to manage stress include:  Keeping a stress diary. This can help you learn what triggers your stress and ways to control your response.  Understanding what your limits are and saying no to requests or events that lead to a schedule that is too full.  Thinking about how you respond to certain situations. You may not be able to control everything, but you can control how you react.  Adding humor to your life by watching funny films or TV shows.  Making time for activities that help you relax and not feeling guilty about spending your time this way.  Medicines Your health care provider may suggest certain medicines if he or she feels that they will help improve your condition. Avoid using alcohol and other substances that may prevent your medicines from working properly (may interact). It is also important to:  Talk with your pharmacist or health care provider about  all the medicines that you take, their possible side effects, and what medicines are safe to take together.  Make it your goal to take part in all treatment decisions (shared decision-making). This includes giving input on the side effects of medicines. It is best if shared decision-making with your health care provider is part of your total treatment plan.  If your health care provider  prescribes a medicine, you may not notice the full benefits of it for 4-8 weeks. Most people who are treated for depression need to be on medicine for at least 6-12 months after they feel better. If you are taking medicines as part of your treatment, do not stop taking medicines without first talking to your health care provider. You may need to have the medicine slowly decreased (tapered) over time to decrease the risk of harmful side effects. Relationships Your health care provider may suggest family therapy along with individual therapy and drug therapy. While there may not be family problems that are causing you to feel depressed, it is still important to make sure your family learns as much as they can about your mental health. Having your family's support can help make your treatment successful. How to recognize changes in your condition Everyone has a different response to treatment for depression. Recovery from major depression happens when you have not had signs of major depression for two months. This may mean that you will start to:  Have more interest in doing activities.  Feel less hopeless than you did 2 months ago.  Have more energy.  Overeat less often, or have better or improving appetite.  Have better concentration.  Your health care provider will work with you to decide the next steps in your recovery. It is also important to recognize when your condition is getting worse. Watch for these signs:  Having fatigue or low energy.  Eating too much or too little.  Sleeping too much or too little.  Feeling restless, agitated, or hopeless.  Having trouble concentrating or making decisions.  Having unexplained physical complaints.  Feeling irritable, angry, or aggressive.  Get help as soon as you or your family members notice these symptoms coming back. How to get support and help from others How to talk with friends and family members about your condition Talking to  friends and family members about your condition can provide you with one way to get support and guidance. Reach out to trusted friends or family members, explain your symptoms to them, and let them know that you are working with a health care provider to treat your depression. Financial resources Not all insurance plans cover mental health care, so it is important to check with your insurance carrier. If paying for co-pays or counseling services is a problem, search for a local or county mental health care center. They may be able to offer public mental health care services at low or no cost when you are not able to see a private health care provider. If you are taking medicine for depression, you may be able to get the generic form, which may be less expensive. Some makers of prescription medicines also offer help to patients who cannot afford the medicines they need. Follow these instructions at home:  Get the right amount and quality of sleep.  Cut down on using caffeine, tobacco, alcohol, and other potentially harmful substances.  Try to exercise, such as walking or lifting small weights.  Take over-the-counter and prescription medicines only as told by your health  care provider.  Eat a healthy diet that includes plenty of vegetables, fruits, whole grains, low-fat dairy products, and lean protein. Do not eat a lot of foods that are high in solid fats, added sugars, or salt.  Keep all follow-up visits as told by your health care provider. This is important. Contact a health care provider if:  You stop taking your antidepressant medicines, and you have any of these symptoms: ? Nausea. ? Headache. ? Feeling lightheaded. ? Chills and body aches. ? Not being able to sleep (insomnia).  You or your friends and family think your depression is getting worse. Get help right away if:  You have thoughts of hurting yourself or others. If you ever feel like you may hurt yourself or others, or  have thoughts about taking your own life, get help right away. You can go to your nearest emergency department or call:  Your local emergency services (911 in the [place]  A suicide crisis helpline, such as the National Suicide Prevention Lifeline at 401 419 5275. This is open 24-hours a day.  Summary  If you are living with depression, there are ways to help you recover from it and also ways to prevent it from coming back.  Work with your health care team to create a management plan that includes counseling, stress management techniques, and healthy lifestyle habits. This information is not intended to replace advice given to you by your health care provider. Make sure you discuss any questions you have with your health care provider. Document Released: 06/25/2016 Document Revised: 06/25/2016 Document Reviewed: 06/25/2016 Elsevier Interactive Patient Education  [employer].

## 2018-02-04 NOTE — Progress Notes (Signed)
Subjective:  Patient ID: Angela Schwartz, female    DOB: 1985-12-05  Age: 32 y.o. MRN: [id number]  CC: Follow-up (3 mo fu--anxiety and depression same/ refill on nizoral?) and Rash (rash back of the right knee)  Rash  This is a new problem. The current episode started in the past 7 days. The problem is unchanged. The affected locations include the right lower leg (behind right knee). The rash is characterized by redness and itchiness. Associated with: onset after wear knee brace. Pertinent negatives include no fatigue, fever or joint pain. Past treatments include nothing. Her past medical history is significant for allergies and eczema.    Ms. Angela Schwartz is here for anxiety and depression f/up. States she missed her appt with psychology. Reports no change in mood. She copes by avoiding contact with public.  Reviewed [facility] today  Outpatient Medications Prior to Visit  Medication Sig Dispense Refill  . acetaminophen (TYLENOL) 500 MG tablet Take 500 mg by mouth every 6 (six) hours as needed for pain.    . Diclofenac Sodium (PENNSAID) 2 % SOLN Place 1 application onto the skin 2 (two) times daily. 112 g 0  . levonorgestrel (MIRENA) 20 MCG/24HR IUD 1 each by Intrauterine route once.    . mupirocin ointment (BACTROBAN) 2 % Apply 1 application topically 2 (two) times daily. 22 g 0  . ketoconazole (NIZORAL) 2 % shampoo Apply 1 application topically 2 (two) times a week. To scalp 120 mL 0  . hydrocortisone 2.5 % cream Apply topically 2 (two) times daily. (Patient not taking: Reported on 02/04/2018) 30 g 0   No facility-administered medications prior to visit.     ROS See HPI  Objective:  BP 134/84   Pulse 88   Temp 98.5 F (36.9 C) (Oral)   Ht 5\' 5"  (1.651 m)   Wt 212 lb (96.2 kg)   SpO2 96%   BMI 35.28 kg/m   BP Readings from Last 3 Encounters:  02/04/18 134/84  12/31/17 130/90  11/26/17 134/84    Wt Readings from Last 3 Encounters:  02/04/18 212 lb (96.2 kg)  11/26/17 211 lb  9.6 oz (96 kg)  10/15/17 215 lb (97.5 kg)    Physical Exam  Constitutional: She is oriented to person, place, and time. No distress.  Cardiovascular: Normal rate.  Pulmonary/Chest: Effort normal.  Neurological: She is alert and oriented to person, place, and time.  Skin: Skin is warm and dry. Rash noted. Rash is maculopapular.     Psychiatric: She has a normal mood and affect. Her behavior is normal. Thought content normal.  Vitals reviewed.   Lab Results  Component Value Date   WBC 16.1 (H) 03/22/2007   HGB 10.4 (L) 03/22/2007   HCT 30.0 (L) 03/22/2007   PLT 207 03/22/2007    Assessment & Plan:   Victorino DikeJennifer was seen today for follow-up and rash.  Diagnoses and all orders for this visit:  Adjustment disorder with mixed anxiety and depressed mood  Atopic dermatitis, unspecified type -     ketoconazole (NIZORAL) 2 % shampoo; Apply 1 application topically 2 (two) times a week. To scalp -     hydrocortisone 2.5 % cream; Apply topically 2 (two) times daily.   I am having Rhea BleacherJennifer N. Hayse maintain her acetaminophen, levonorgestrel, Diclofenac Sodium, mupirocin ointment, ketoconazole, and hydrocortisone.  Meds ordered this encounter  Medications  . ketoconazole (NIZORAL) 2 % shampoo    Sig: Apply 1 application topically 2 (two) times a week. To scalp  Dispense:  120 mL    Refill:  0    Order Specific Question:   Supervising Provider    Answer:   Dianne Dun [[id number]]  . hydrocortisone 2.5 % cream    Sig: Apply topically 2 (two) times daily.    Dispense:  30 g    Refill:  0    Order Specific Question:   Supervising Provider    Answer:   Dianne Dun [[id number]]    Follow-up: Return in about 5 months (around 07/07/2018) for CPE (fasting).  Alysia Penna, NP

## 2018-03-06 ENCOUNTER — Encounter: Payer: Self-pay | Admitting: Nurse Practitioner

## 2018-03-06 NOTE — Progress Notes (Signed)
Abstract and sent to be scanned 

## 2018-03-21 DIAGNOSIS — R300 Dysuria: Secondary | ICD-10-CM | POA: Diagnosis not present

## 2018-03-21 DIAGNOSIS — N760 Acute vaginitis: Secondary | ICD-10-CM | POA: Diagnosis not present

## 2018-06-02 DIAGNOSIS — N949 Unspecified condition associated with female genital organs and menstrual cycle: Secondary | ICD-10-CM | POA: Diagnosis not present

## 2018-06-02 DIAGNOSIS — N898 Other specified noninflammatory disorders of vagina: Secondary | ICD-10-CM | POA: Diagnosis not present

## 2018-06-02 DIAGNOSIS — N760 Acute vaginitis: Secondary | ICD-10-CM | POA: Diagnosis not present

## 2018-06-11 ENCOUNTER — Ambulatory Visit: Payer: BLUE CROSS/BLUE SHIELD | Admitting: Nurse Practitioner

## 2018-06-17 ENCOUNTER — Ambulatory Visit: Payer: BLUE CROSS/BLUE SHIELD | Admitting: Nurse Practitioner

## 2018-06-17 DIAGNOSIS — Z0289 Encounter for other administrative examinations: Secondary | ICD-10-CM

## 2018-07-07 ENCOUNTER — Encounter: Payer: Self-pay | Admitting: Nurse Practitioner

## 2018-07-09 ENCOUNTER — Ambulatory Visit (INDEPENDENT_AMBULATORY_CARE_PROVIDER_SITE_OTHER): Payer: BLUE CROSS/BLUE SHIELD | Admitting: Nurse Practitioner

## 2018-07-09 ENCOUNTER — Encounter: Payer: Self-pay | Admitting: Nurse Practitioner

## 2018-07-09 VITALS — BP 122/84 | HR 91 | Temp 99.0°F | Ht 65.0 in | Wt 217.8 lb

## 2018-07-09 DIAGNOSIS — F322 Major depressive disorder, single episode, severe without psychotic features: Secondary | ICD-10-CM | POA: Diagnosis not present

## 2018-07-09 DIAGNOSIS — Z0001 Encounter for general adult medical examination with abnormal findings: Secondary | ICD-10-CM | POA: Diagnosis not present

## 2018-07-09 DIAGNOSIS — Z136 Encounter for screening for cardiovascular disorders: Secondary | ICD-10-CM | POA: Diagnosis not present

## 2018-07-09 DIAGNOSIS — Z13220 Encounter for screening for lipoid disorders: Secondary | ICD-10-CM

## 2018-07-09 DIAGNOSIS — Z8619 Personal history of other infectious and parasitic diseases: Secondary | ICD-10-CM | POA: Insufficient documentation

## 2018-07-09 LAB — COMPREHENSIVE METABOLIC PANEL
ALT: 13 U/L (ref 0–35)
AST: 25 U/L (ref 0–37)
Albumin: 4.2 g/dL (ref 3.5–5.2)
Alkaline Phosphatase: 53 U/L (ref 39–117)
BUN: 11 mg/dL (ref 6–23)
CHLORIDE: 110 meq/L (ref 96–112)
CO2: 20 meq/L (ref 19–32)
Calcium: 9.1 mg/dL (ref 8.4–10.5)
Creatinine, Ser: 0.85 mg/dL (ref 0.40–1.20)
GFR: 98.24 mL/min (ref >60.00)
GLUCOSE: 93 mg/dL (ref 70–99)
SODIUM: 140 meq/L (ref 135–145)
Total Bilirubin: 0.7 mg/dL (ref 0.2–1.2)
Total Protein: 7.3 g/dL (ref 6.0–8.3)

## 2018-07-09 LAB — LIPID PANEL
Cholesterol: 154 mg/dL (ref 0–200)
HDL: 59.1 mg/dL (ref >39.00)
LDL CALC: 82 mg/dL (ref 0–99)
NONHDL: 94.77
Total CHOL/HDL Ratio: 3
Triglycerides: 65 mg/dL (ref 0.0–149.0)
VLDL: 13 mg/dL (ref 0.0–40.0)

## 2018-07-09 LAB — CBC
HEMATOCRIT: 40.5 % (ref 36.0–46.0)
Hemoglobin: 14 g/dL (ref 12.0–15.0)
MCHC: 34.5 g/dL (ref 30.0–36.0)
MCV: 98.3 fl (ref 78.0–100.0)
PLATELETS: 245 10*3/uL (ref 150.0–400.0)
RBC: 4.12 Mil/uL (ref 3.87–5.11)
RDW: 13.4 % (ref 11.5–15.5)
WBC: 8 10*3/uL (ref 4.0–10.5)

## 2018-07-09 LAB — TSH: TSH: 0.7 u[IU]/mL (ref 0.35–4.50)

## 2018-07-09 MED ORDER — BUPROPION HCL ER (XL) 150 MG PO TB24: 150.0000 mg | ORAL_TABLET | Freq: Every day | ORAL | 1 refills | Status: DC

## 2018-07-09 NOTE — Patient Instructions (Signed)
Go to lab for blood draw,  Start wellbutrin today. You will be contacted to schedule appt with psychology. Encourage to start regular exercise regimen.  Living With Depression Everyone experiences occasional disappointment, sadness, and loss in their lives. When you are feeling down, blue, or sad for at least 2 weeks in a row, it may mean that you have depression. Depression can affect your thoughts and feelings, relationships, daily activities, and physical health. It is caused by changes in the way your brain functions. If you receive a diagnosis of depression, your health care provider will tell you which type of depression you have and what treatment options are available to you. If you are living with depression, there are ways to help you recover from it and also ways to prevent it from coming back. How to cope with lifestyle changes Coping with stress Stress is your body's reaction to life changes and events, both good and bad. Stressful situations may include:  Getting married.  The death of a spouse.  Losing a job.  Retiring.  Having a baby.  Stress can last just a few hours or it can be ongoing. Stress can play a major role in depression, so it is important to learn both how to cope with stress and how to think about it differently. Talk with your health care provider or a counselor if you would like to learn more about stress reduction. He or she may suggest some stress reduction techniques, such as:  Music therapy. This can include creating music or listening to music. Choose music that you enjoy and that inspires you.  Mindfulness-based meditation. This kind of meditation can be done while sitting or walking. It involves being aware of your normal breaths, rather than trying to control your breathing.  Centering prayer. This is a kind of meditation that involves focusing on a spiritual word or phrase. Choose a word, phrase, or sacred image that is meaningful to you and that  brings you peace.  Deep breathing. To do this, expand your stomach and inhale slowly through your nose. Hold your breath for 3-5 seconds, then exhale slowly, allowing your stomach muscles to relax.  Muscle relaxation. This involves intentionally tensing muscles then relaxing them.  Choose a stress reduction technique that fits your lifestyle and personality. Stress reduction techniques take time and practice to develop. Set aside 5-15 minutes a day to do them. Therapists can offer training in these techniques. The training may be covered by some insurance plans. Other things you can do to manage stress include:  Keeping a stress diary. This can help you learn what triggers your stress and ways to control your response.  Understanding what your limits are and saying no to requests or events that lead to a schedule that is too full.  Thinking about how you respond to certain situations. You may not be able to control everything, but you can control how you react.  Adding humor to your life by watching funny films or TV shows.  Making time for activities that help you relax and not feeling guilty about spending your time this way.  Medicines Your health care provider may suggest certain medicines if he or she feels that they will help improve your condition. Avoid using alcohol and other substances that may prevent your medicines from working properly (may interact). It is also important to:  Talk with your pharmacist or health care provider about all the medicines that you take, their possible side effects, and what medicines  are safe to take together.  Make it your goal to take part in all treatment decisions (shared decision-making). This includes giving input on the side effects of medicines. It is best if shared decision-making with your health care provider is part of your total treatment plan.  If your health care provider prescribes a medicine, you may not notice the full benefits of it  for 4-8 weeks. Most people who are treated for depression need to be on medicine for at least 6-12 months after they feel better. If you are taking medicines as part of your treatment, do not stop taking medicines without first talking to your health care provider. You may need to have the medicine slowly decreased (tapered) over time to decrease the risk of harmful side effects. Relationships Your health care provider may suggest family therapy along with individual therapy and drug therapy. While there may not be family problems that are causing you to feel depressed, it is still important to make sure your family learns as much as they can about your mental health. Having your family's support can help make your treatment successful. How to recognize changes in your condition Everyone has a different response to treatment for depression. Recovery from major depression happens when you have not had signs of major depression for two months. This may mean that you will start to:  Have more interest in doing activities.  Feel less hopeless than you did 2 months ago.  Have more energy.  Overeat less often, or have better or improving appetite.  Have better concentration.  Your health care provider will work with you to decide the next steps in your recovery. It is also important to recognize when your condition is getting worse. Watch for these signs:  Having fatigue or low energy.  Eating too much or too little.  Sleeping too much or too little.  Feeling restless, agitated, or hopeless.  Having trouble concentrating or making decisions.  Having unexplained physical complaints.  Feeling irritable, angry, or aggressive.  Get help as soon as you or your family members notice these symptoms coming back. How to get support and help from others How to talk with friends and family members about your condition Talking to friends and family members about your condition can provide you with  one way to get support and guidance. Reach out to trusted friends or family members, explain your symptoms to them, and let them know that you are working with a health care provider to treat your depression. Financial resources Not all insurance plans cover mental health care, so it is important to check with your insurance carrier. If paying for co-pays or counseling services is a problem, search for a local or county mental health care center. They may be able to offer public mental health care services at low or no cost when you are not able to see a private health care provider. If you are taking medicine for depression, you may be able to get the generic form, which may be less expensive. Some makers of prescription medicines also offer help to patients who cannot afford the medicines they need. Follow these instructions at home:  Get the right amount and quality of sleep.  Cut down on using caffeine, tobacco, alcohol, and other potentially harmful substances.  Try to exercise, such as walking or lifting small weights.  Take over-the-counter and prescription medicines only as told by your health care provider.  Eat a healthy diet that includes plenty of vegetables, fruits,  whole grains, low-fat dairy products, and lean protein. Do not eat a lot of foods that are high in solid fats, added sugars, or salt.  Keep all follow-up visits as told by your health care provider. This is important. Contact a health care provider if:  You stop taking your antidepressant medicines, and you have any of these symptoms: ? Nausea. ? Headache. ? Feeling lightheaded. ? Chills and body aches. ? Not being able to sleep (insomnia).  You or your friends and family think your depression is getting worse. Get help right away if:  You have thoughts of hurting yourself or others. If you ever feel like you may hurt yourself or others, or have thoughts about taking your own life, get help right away. You can  go to your nearest emergency department or call:  Your local emergency services (911 in the [place]  A suicide crisis helpline, such as the National Suicide Prevention Lifeline at 2367914932. This is open 24-hours a day.  Summary  If you are living with depression, there are ways to help you recover from it and also ways to prevent it from coming back.  Work with your health care team to create a management plan that includes counseling, stress management techniques, and healthy lifestyle habits. This information is not intended to replace advice given to you by your health care provider. Make sure you discuss any questions you have with your health care provider. Document Released: 06/25/2016 Document Revised: 06/25/2016 Document Reviewed: 06/25/2016 Elsevier Interactive Patient Education  [employer].

## 2018-07-09 NOTE — Assessment & Plan Note (Signed)
Used prozac and lexapro in past (ineffective) Start wellbutrin Entered referral for psychologist

## 2018-07-09 NOTE — Progress Notes (Signed)
Subjective:    Patient ID: Angela Schwartz, female    DOB: 10/25/1985, 32 y.o.   MRN: [id number]  Patient presents today for complete physical and management of anxiety and depression  HPI  Sexual History (orientation,birth control, marital status, STD):sexually active, mirena present, last PAP: normal with negative HPV  Depression/Suicide:Lexapro(not effective) and prozac used in past. Will like to start medication and another referssal to counselor. Depression screen [facility] 2/9 07/09/2018 02/04/2018 10/15/2017 10/15/2017  Decreased Interest 3 2 2 1   Down, Depressed, Hopeless 3 2 2 1   PHQ - 2 Score 6 4 4 2   Altered sleeping 3 2 3  -  Tired, decreased energy 2 1 3  -  Change in appetite 3 1 3  -  Feeling bad or failure about yourself  2 0 0 -  Trouble concentrating 1 0 1 -  Moving slowly or fidgety/restless 2 1 1  -  Suicidal thoughts 0 0 0 -  PHQ-9 Score 19 9 15  -  Difficult doing work/chores Very difficult - - -   GAD 7 : Generalized Anxiety Score 07/09/2018 02/04/2018 10/15/2017  Nervous, Anxious, on Edge 3 1 2   Control/stop worrying 3 2 1   Worry too much - different things 3 2 1   Trouble relaxing 3 2 1   Restless 2 1 2   Easily annoyed or irritable 3 3 3   Afraid - awful might happen 0 0 0  Total GAD 7 Score 17 11 10   Anxiety Difficulty Very difficult - -   Vision:up to date  Dental:will schedule  Immunizations: (TDAP, Hep C screen, Pneumovax, Influenza, zoster)  Health Maintenance  Topic Date Due  . Flu Shot  11/03/2018*  . HIV Screening  11/27/2018*  . Pap Smear  07/10/2020  . Tetanus Vaccine  08/21/2023  *Topic was postponed. The date shown is not the original due date.   Diet:regular.  Weight:  Wt Readings from Last 3 Encounters:  07/09/18 217 lb 12.8 oz (98.8 kg)  02/04/18 212 lb (96.2 kg)  11/26/17 211 lb 9.6 oz (96 kg)   Exercise:none  Fall Risk: Fall Risk  07/09/2018 10/15/2017  Falls in the past year? 0 No  Number falls in past yr: 0 -   Medications and  allergies reviewed with patient and updated if appropriate.  Patient Active Problem List   Diagnosis Date Noted  . Hx of herpes genitalis 07/09/2018  . Atopic dermatitis 02/04/2018  . Acute pain of left knee 10/15/2017  . Depression, major, single episode, severe (HCC) 10/15/2017    Current Outpatient Medications on File Prior to Visit  Medication Sig Dispense Refill  . acetaminophen (TYLENOL) 500 MG tablet Take 500 mg by mouth every 6 (six) hours as needed for pain.    . hydrocortisone 2.5 % cream Apply topically 2 (two) times daily. 30 g 0  . ketoconazole (NIZORAL) 2 % shampoo Apply 1 application topically 2 (two) times a week. To scalp 120 mL 0  . levonorgestrel (MIRENA) 20 MCG/24HR IUD 1 each by Intrauterine route once.    . mupirocin ointment (BACTROBAN) 2 % Apply 1 application topically 2 (two) times daily. 22 g 0  . Diclofenac Sodium (PENNSAID) 2 % SOLN Place 1 application onto the skin 2 (two) times daily. (Patient not taking: Reported on 07/09/2018) 112 g 0  . metroNIDAZOLE (FLAGYL) 500 MG tablet   0  . valACYclovir (VALTREX) 500 MG tablet   0   No current facility-administered medications on file prior to visit.     Past  Medical History:  Diagnosis Date  . Anxiety   . Arthritis   . Depression   . Substance abuse [facility](HCC)     Past Surgical History:  Procedure Laterality Date  . CESAREAN SECTION    . MOUTH SURGERY      Social History   Socioeconomic History  . Marital status: Single    Spouse name: Not on file  . Number of children: 1  . Years of education: Not on file  . Highest education level: Not on file  Occupational History  . 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  Social Needs  . Financial resource strain: Not on file  . Food insecurity:    Worry: Not on file    Inability: Not on file  . Transportation needs:    Medical: Not on file    Non-medical: Not on file  Tobacco Use  . Smoking status: Former Games developermoker  . Smokeless tobacco: Never Used    Substance and Sexual Activity  . Alcohol use: Yes    Comment: every weekend  . Drug use: Yes    Types: Marijuana    Comment: once in awhile  . Sexual activity: Yes    Birth control/protection: IUD    Comment: Mirena- inserted 2016  Lifestyle  . Physical activity:    Days per week: Not on file    Minutes per session: Not on file  . Stress: Not on file  Relationships  . Social connections:    Talks on phone: Not on file    Gets together: Not on file    Attends religious service: Not on file    Active member of club or organization: Not on file    Attends meetings of clubs or organizations: Not on file    Relationship status: Not on file  Other Topics Concern  . Not on file  Social History Narrative  . Not on file    Family History  Problem Relation Age of Onset  . Hypertension Mother   . Hyperlipidemia Mother   . Hypertension Father   . Hyperlipidemia Father   . Heart failure Father   . Diabetes Father   . Hypertension Brother   . Cancer Maternal Aunt        cervical        Review of Systems  Constitutional: Negative for fever, malaise/fatigue and weight loss.  HENT: Negative for congestion and sore throat.   Eyes:       Negative for visual changes  Respiratory: Negative for cough and shortness of breath.   Cardiovascular: Negative for chest pain, palpitations and leg swelling.  Gastrointestinal: Negative for blood in stool, constipation, diarrhea and heartburn.  Genitourinary: Negative for dysuria, frequency and urgency.  Musculoskeletal: Negative for falls, joint pain and myalgias.  Skin: Negative for rash.  Neurological: Negative for dizziness, sensory change and headaches.  Endo/Heme/Allergies: Does not bruise/bleed easily.  Psychiatric/Behavioral: Positive for depression. Negative for hallucinations, memory loss, substance abuse and suicidal ideas. The patient is nervous/anxious and has insomnia.     Objective:   Vitals:   07/09/18 0957  BP: 122/84   Pulse: 91  Temp: 99 F (37.2 C)  SpO2: 100%    Body mass index is 36.24 kg/m.   Physical Examination:  Physical Exam  Constitutional: She is oriented to person, place, and time. She appears well-developed and well-nourished.  HENT:  Right Ear: External ear normal.  Left Ear: External ear normal.  Mouth/Throat: Oropharynx is clear and moist. No  trismus in the jaw. Abnormal dentition. Dental caries present. No dental abscesses. Tonsils are 0 on the right. Tonsils are 0 on the left.  Eyes: Pupils are equal, round, and reactive to light. EOM are normal.  Neck: Normal range of motion. No thyromegaly present.  Cardiovascular: Normal rate, regular rhythm, normal heart sounds and intact distal pulses.  Pulmonary/Chest: Effort normal and breath sounds normal. Right breast exhibits no inverted nipple, no mass, no nipple discharge, no skin change and no tenderness. Left breast exhibits no inverted nipple, no mass, no nipple discharge, no skin change and no tenderness. Breasts are symmetrical.  Abdominal: Soft. Bowel sounds are normal.  Genitourinary:  Genitourinary Comments: declined  Musculoskeletal: Normal range of motion. She exhibits no edema.  Lymphadenopathy:    She has no cervical adenopathy.  Neurological: She is alert and oriented to person, place, and time.  Skin: Skin is warm and dry.  Psychiatric: She has a normal mood and affect. Her behavior is normal. Thought content normal.  Vitals reviewed.   ASSESSMENT and PLAN:  Samoria was seen today for annual exam.  Diagnoses and all orders for this visit:  Encounter for preventative adult health care exam with abnormal findings -     Comprehensive metabolic panel -     CBC -     TSH -     Lipid panel  Current severe episode of major depressive disorder without psychotic features without prior episode (HCC) -     buPROPion (WELLBUTRIN XL) 150 MG 24 hr tablet; Take 1 tablet (150 mg total) by mouth daily. -     Ambulatory  referral to Psychology  Encounter for lipid screening for cardiovascular disease    Depression, major, single episode, severe (HCC) Used prozac and lexapro in past (ineffective) Start wellbutrin Entered referral for psychologist      Problem List Items Addressed This Visit      Other   Depression, major, single episode, severe (HCC)    Used prozac and lexapro in past (ineffective) Start wellbutrin Entered referral for psychologist      Relevant Medications   buPROPion (WELLBUTRIN XL) 150 MG 24 hr tablet    Other Visit Diagnoses    Encounter for preventative adult health care exam with abnormal findings    -  Primary   Relevant Orders   Comprehensive metabolic panel   CBC   TSH   Lipid panel   Encounter for lipid screening for cardiovascular disease           Follow up: Return in about 4 weeks (around 08/06/2018) for depression ( ).  Alysia Penna, NP

## 2018-07-26 ENCOUNTER — Other Ambulatory Visit: Payer: Self-pay | Admitting: Nurse Practitioner

## 2018-07-26 DIAGNOSIS — F322 Major depressive disorder, single episode, severe without psychotic features: Secondary | ICD-10-CM

## 2018-07-29 ENCOUNTER — Other Ambulatory Visit: Payer: Self-pay | Admitting: Nurse Practitioner

## 2018-07-29 DIAGNOSIS — F322 Major depressive disorder, single episode, severe without psychotic features: Secondary | ICD-10-CM

## 2018-08-13 ENCOUNTER — Ambulatory Visit: Payer: BLUE CROSS/BLUE SHIELD | Admitting: Nurse Practitioner

## 2018-08-22 ENCOUNTER — Ambulatory Visit (INDEPENDENT_AMBULATORY_CARE_PROVIDER_SITE_OTHER): Payer: BLUE CROSS/BLUE SHIELD | Admitting: Psychology

## 2018-08-22 DIAGNOSIS — F323 Major depressive disorder, single episode, severe with psychotic features: Secondary | ICD-10-CM | POA: Diagnosis not present

## 2018-08-22 DIAGNOSIS — F419 Anxiety disorder, unspecified: Secondary | ICD-10-CM | POA: Diagnosis not present

## 2018-08-22 DIAGNOSIS — F1010 Alcohol abuse, uncomplicated: Secondary | ICD-10-CM

## 2018-08-28 ENCOUNTER — Ambulatory Visit: Payer: BLUE CROSS/BLUE SHIELD | Admitting: Psychology

## 2018-09-01 ENCOUNTER — Ambulatory Visit: Payer: BLUE CROSS/BLUE SHIELD | Admitting: Psychology

## 2018-09-16 ENCOUNTER — Ambulatory Visit: Payer: BLUE CROSS/BLUE SHIELD | Admitting: Psychology

## 2018-09-23 ENCOUNTER — Ambulatory Visit: Payer: BLUE CROSS/BLUE SHIELD | Admitting: Nurse Practitioner

## 2018-09-23 ENCOUNTER — Encounter: Payer: Self-pay | Admitting: Nurse Practitioner

## 2018-09-23 VITALS — BP 150/92 | HR 82 | Temp 98.2°F | Ht 65.0 in | Wt 220.6 lb

## 2018-09-23 DIAGNOSIS — J0140 Acute pansinusitis, unspecified: Secondary | ICD-10-CM

## 2018-09-23 MED ORDER — GUAIFENESIN ER 600 MG PO TB12: 600.0000 mg | ORAL_TABLET | Freq: Two times a day (BID) | ORAL | 0 refills | Status: DC | PRN

## 2018-09-23 MED ORDER — CEFUROXIME AXETIL 250 MG PO TABS: 250.0000 mg | ORAL_TABLET | Freq: Two times a day (BID) | ORAL | 0 refills | Status: DC

## 2018-09-23 MED ORDER — FLUTICASONE PROPIONATE 50 MCG/ACT NA SUSP: 2.0000 | Freq: Every day | NASAL | 0 refills | Status: DC

## 2018-09-23 MED ORDER — SALINE SPRAY 0.65 % NA SOLN: 1.0000 | NASAL | 0 refills | Status: DC | PRN

## 2018-09-23 NOTE — Progress Notes (Signed)
Subjective:  Patient ID: Angela Schwartz, female    DOB: 1985-09-04  Age: 33 y.o. MRN: [id number]  CC: Headache (congestion, toothache, left ear pain, headache pressure/ 14 day/ OTC musinex)  Sinus Problem  This is a new problem. The current episode started 1 to 4 weeks ago. The problem has been gradually worsening since onset. There has been no fever. Associated symptoms include chills, congestion, coughing, ear pain, headaches, sinus pressure, a sore throat and swollen glands. Pertinent negatives include no shortness of breath. Past treatments include oral decongestants. The treatment provided no relief.   Reviewed past Medical, Social and Family history today.  Outpatient Medications Prior to Visit  Medication Sig Dispense Refill  . acetaminophen (TYLENOL) 500 MG tablet Take 500 mg by mouth every 6 (six) hours as needed for pain.    Marland Kitchen buPROPion (WELLBUTRIN XL) 150 MG 24 hr tablet Take 1 tablet (150 mg total) by mouth daily. 30 tablet 1  . hydrocortisone 2.5 % cream Apply topically 2 (two) times daily. 30 g 0  . ketoconazole (NIZORAL) 2 % shampoo Apply 1 application topically 2 (two) times a week. To scalp 120 mL 0  . levonorgestrel (MIRENA) 20 MCG/24HR IUD 1 each by Intrauterine route once.    . mupirocin ointment (BACTROBAN) 2 % Apply 1 application topically 2 (two) times daily. 22 g 0  . valACYclovir (VALTREX) 500 MG tablet   0  . Diclofenac Sodium (PENNSAID) 2 % SOLN Place 1 application onto the skin 2 (two) times daily. (Patient not taking: Reported on 07/09/2018) 112 g 0  . metroNIDAZOLE (FLAGYL) 500 MG tablet   0   No facility-administered medications prior to visit.     ROS See HPI  Objective:  BP (!) 150/92   Pulse 82   Temp 98.2 F (36.8 C) (Oral)   Ht 5\' 5"  (1.651 m)   Wt 220 lb 9.6 oz (100.1 kg)   SpO2 98%   BMI 36.71 kg/m   BP Readings from Last 3 Encounters:  09/23/18 (!) 150/92  07/09/18 122/84  02/04/18 134/84    Wt Readings from Last 3 Encounters:   09/23/18 220 lb 9.6 oz (100.1 kg)  07/09/18 217 lb 12.8 oz (98.8 kg)  02/04/18 212 lb (96.2 kg)    Physical Exam Vitals signs reviewed.  Constitutional:      General: She is not in acute distress.    Appearance: She is ill-appearing.  HENT:     Right Ear: Ear canal and external ear normal. A middle ear effusion is present. Tympanic membrane is not injected.     Left Ear: Ear canal and external ear normal. A middle ear effusion is present. Tympanic membrane is not injected.     Nose: Mucosal edema and rhinorrhea present.     Right Turbinates: Swollen.     Left Turbinates: Swollen.     Right Sinus: Maxillary sinus tenderness and frontal sinus tenderness present.     Left Sinus: Maxillary sinus tenderness and frontal sinus tenderness present.     Mouth/Throat:     Pharynx: Posterior oropharyngeal erythema present.     Tonsils: No tonsillar exudate or tonsillar abscesses. Swelling: 2+ on the right. 2+ on the left.  Cardiovascular:     Rate and Rhythm: Normal rate and regular rhythm.  Pulmonary:     Effort: Pulmonary effort is normal.     Breath sounds: Normal breath sounds.  Lymphadenopathy:     Cervical: No cervical adenopathy.  Neurological:  Mental Status: She is alert.     Lab Results  Component Value Date   WBC 8.0 07/09/2018   HGB 14.0 07/09/2018   HCT 40.5 07/09/2018   PLT 245.0 07/09/2018   GLUCOSE 93 07/09/2018   CHOL 154 07/09/2018   TRIG 65.0 07/09/2018   HDL 59.10 07/09/2018   LDLCALC 82 07/09/2018   ALT 13 07/09/2018   AST 25 07/09/2018   NA 140 07/09/2018   K 4.8 Hemolyzed 07/09/2018   CL 110 07/09/2018   CREATININE 0.85 07/09/2018   BUN 11 07/09/2018   CO2 20 07/09/2018   TSH 0.70 07/09/2018    Assessment & Plan:   Meylin was seen today for headache.  Diagnoses and all orders for this visit:  Acute non-recurrent pansinusitis -     guaiFENesin (MUCINEX) 600 MG 12 hr tablet; Take 1 tablet (600 mg total) by mouth 2 (two) times daily as  needed for cough or to loosen phlegm. -     sodium chloride (OCEAN) 0.65 % SOLN nasal spray; Place 1 spray into both nostrils as needed for congestion. -     fluticasone (FLONASE) 50 MCG/ACT nasal spray; Place 2 sprays into both nostrils daily. -     cefUROXime (CEFTIN) 250 MG tablet; Take 1 tablet (250 mg total) by mouth 2 (two) times daily with a meal.   I am having [name] start on guaiFENesin, sodium chloride, fluticasone, and cefUROXime. I am also having her maintain her acetaminophen, levonorgestrel, Diclofenac Sodium, mupirocin ointment, ketoconazole, hydrocortisone, valACYclovir, metroNIDAZOLE, and buPROPion.  Meds ordered this encounter  Medications  . guaiFENesin (MUCINEX) 600 MG 12 hr tablet    Sig: Take 1 tablet (600 mg total) by mouth 2 (two) times daily as needed for cough or to loosen phlegm.    Dispense:  14 tablet    Refill:  0    Order Specific Question:   Supervising Provider    Answer:   Dianne Dun [[id number]]  . sodium chloride (OCEAN) 0.65 % SOLN nasal spray    Sig: Place 1 spray into both nostrils as needed for congestion.    Dispense:  15 mL    Refill:  0    Order Specific Question:   Supervising Provider    Answer:   Dianne Dun [[id number]]  . fluticasone (FLONASE) 50 MCG/ACT nasal spray    Sig: Place 2 sprays into both nostrils daily.    Dispense:  16 g    Refill:  0    Order Specific Question:   Supervising Provider    Answer:   Dianne Dun [[id number]]  . cefUROXime (CEFTIN) 250 MG tablet    Sig: Take 1 tablet (250 mg total) by mouth 2 (two) times daily with a meal.    Dispense:  14 tablet    Refill:  0    Order Specific Question:   Supervising Provider    Answer:   Dianne Dun [[id number]]    Problem List Items Addressed This Visit    None    Visit Diagnoses    Acute non-recurrent pansinusitis    -  Primary   Relevant Medications   guaiFENesin (MUCINEX) 600 MG 12 hr tablet   sodium chloride (OCEAN) 0.65 % SOLN nasal spray   fluticasone (FLONASE) 50  MCG/ACT nasal spray   cefUROXime (CEFTIN) 250 MG tablet       Follow-up: No follow-ups on file.  Alysia Penna, NP

## 2018-09-23 NOTE — Patient Instructions (Addendum)
Maintain adequate oral hydration.  Stop any oral decongestant due to elevated BP. Return to office if BP persistently >150/80 x 1week.

## 2018-10-25 ENCOUNTER — Other Ambulatory Visit: Payer: Self-pay | Admitting: Nurse Practitioner

## 2018-10-25 DIAGNOSIS — F322 Major depressive disorder, single episode, severe without psychotic features: Secondary | ICD-10-CM

## 2018-12-09 ENCOUNTER — Telehealth: Payer: Self-pay | Admitting: Nurse Practitioner

## 2018-12-09 NOTE — Telephone Encounter (Signed)
Left vm for the pt to call back, need to offer virtual visit for f/u with N[facility] since 07/2018 appt.

## 2018-12-10 ENCOUNTER — Encounter: Payer: Self-pay | Admitting: Nurse Practitioner

## 2018-12-10 ENCOUNTER — Ambulatory Visit (INDEPENDENT_AMBULATORY_CARE_PROVIDER_SITE_OTHER): Payer: BLUE CROSS/BLUE SHIELD | Admitting: Nurse Practitioner

## 2018-12-10 VITALS — HR 68 | Temp 97.4°F | Ht 65.0 in | Wt 224.2 lb

## 2018-12-10 DIAGNOSIS — K0510 Chronic gingivitis, plaque induced: Secondary | ICD-10-CM

## 2018-12-10 DIAGNOSIS — K0889 Other specified disorders of teeth and supporting structures: Secondary | ICD-10-CM

## 2018-12-10 DIAGNOSIS — K047 Periapical abscess without sinus: Secondary | ICD-10-CM

## 2018-12-10 MED ORDER — CHLORHEXIDINE GLUCONATE 0.12 % MT SOLN: 15.0000 mL | Freq: Two times a day (BID) | OROMUCOSAL | 0 refills | Status: DC

## 2018-12-10 MED ORDER — AMOXICILLIN-POT CLAVULANATE 875-125 MG PO TABS: 1.0000 | ORAL_TABLET | Freq: Two times a day (BID) | ORAL | 0 refills | Status: DC

## 2018-12-10 NOTE — Progress Notes (Signed)
Virtual Visit via Video Note  I connected with Angela Schwartz on 12/10/18 at  2:30 PM EDT by a video enabled telemedicine application and verified that I am speaking with the correct person using two identifiers.  Location: Patient: Home Provider: Office   I discussed the limitations of evaluation and management by telemedicine and the availability of in person appointments. The patient expressed understanding and agreed to proceed.  CC:pt is c/o of sinus issue,nasal congestions with green/yellow mucus,face pressure,left side is worse/ going on since 08/2018. FYI-pt not sure if her bad tooth has something to do to this.   History of Present Illness: Dental Pain   This is a recurrent problem. The current episode started more than 1 month ago. The problem occurs constantly. The problem has been waxing and waning. The pain is moderate. Associated symptoms include facial pain, sinus pressure and thermal sensitivity. Pertinent negatives include no difficulty swallowing, fever or oral bleeding. She has tried acetaminophen for the symptoms. The treatment provided mild relief.  some improvement with cefidinir completed in 09/2018.  Observations/Objective: Physical Exam  Constitutional: She is oriented to person, place, and time.  No vital signs to provide  HENT:  Right Ear: External ear normal.  Left Ear: External ear normal.  Nose: Right sinus exhibits no maxillary sinus tenderness and no frontal sinus tenderness. Left sinus exhibits maxillary sinus tenderness. Left sinus exhibits no frontal sinus tenderness.  Mouth/Throat: Uvula is midline and oropharynx is clear and moist. No trismus in the jaw. No posterior oropharyngeal erythema.    Eyes: Conjunctivae and EOM are normal.  Neck: Normal range of motion. Neck supple.  Pulmonary/Chest: Effort normal.  Neurological: She is alert and oriented to person, place, and time.  Psychiatric: She has a normal mood and affect. Her behavior is  normal.   Assessment and Plan: Tialisa was seen today for sinusitis.  Diagnoses and all orders for this visit:  Dental abscess -     amoxicillin-clavulanate (AUGMENTIN) 875-125 MG tablet; Take 1 tablet by mouth 2 (two) times daily. -     chlorhexidine (PERIDEX) 0.12 % solution; Use as directed 15 mLs in the mouth or throat 2 (two) times daily.  Pain of molar -     amoxicillin-clavulanate (AUGMENTIN) 875-125 MG tablet; Take 1 tablet by mouth 2 (two) times daily.  Gingivitis due to dental plaque -     chlorhexidine (PERIDEX) 0.12 % solution; Use as directed 15 mLs in the mouth or throat 2 (two) times daily.    Follow Up Instructions: Start oral abx and mouth rinse. Make appt with oral surgeon ASAP (name and phone number given to patient) Let me know if your insurance requires a referral.   I discussed the assessment and treatment plan with the patient. The patient was provided an opportunity to ask questions and all were answered. The patient agreed with the plan and demonstrated an understanding of the instructions.   The patient was advised to call back or seek an in-person evaluation if the symptoms worsen or if the condition fails to improve as anticipated.  Alysia Penna, NP

## 2018-12-11 ENCOUNTER — Telehealth: Payer: Self-pay | Admitting: Nurse Practitioner

## 2018-12-11 NOTE — Telephone Encounter (Signed)
Charlotte please advise 

## 2018-12-11 NOTE — Telephone Encounter (Signed)
Copied from CRM [id number]. Topic: Quick Communication - See Telephone Encounter >> Dec 11, 2018 11:04 AM Aretta Nip wrote: CRM for notification. See Telephone encounter for: 12/11/18. PT saw [facility] yesterday and gave her the name of [facility] to contact. [facility] is working very limited hrs and must have a referral faxed over and an Emergency Need form also in order to even be scheduled. If Claris Gower can take care of this they will see her. [facility] Fax is [phone]

## 2018-12-12 ENCOUNTER — Other Ambulatory Visit: Payer: Self-pay

## 2018-12-12 DIAGNOSIS — K047 Periapical abscess without sinus: Secondary | ICD-10-CM

## 2018-12-12 NOTE — Telephone Encounter (Signed)
[facility] is closed on Friday's I called to try and get a Emergency Need form faxed over. They reopen Monday at 900a. I will try again then and will fax over referral at that time.

## 2018-12-12 NOTE — Telephone Encounter (Signed)
Ok to enter referrl and afx to office. Also need Emergency Need form faxed to me.

## 2018-12-15 NOTE — Telephone Encounter (Signed)
Cindee Lame can you help me look out for this form

## 2018-12-15 NOTE — Telephone Encounter (Signed)
Faxed over referral and requested Emergency need form to be faxed back so Claris Gower can fill out.

## 2018-12-16 NOTE — Telephone Encounter (Signed)
Do not see fax come through, will continue you to look for it.

## 2018-12-17 NOTE — Telephone Encounter (Signed)
Spoke with [facility]. They received referral didn't mention needing any more information. I was advised they would give pt a call to schedule appointment.

## 2018-12-17 NOTE — Telephone Encounter (Signed)
Do not any fax come through, who do we need to contact to get this information? Please help

## 2018-12-25 ENCOUNTER — Telehealth: Payer: Self-pay

## 2018-12-25 NOTE — Telephone Encounter (Signed)
Printed pt med list. Made pt aware to call the office and I would bring her med list out upon arrival.  Copied from CRM [id number]. Topic: General - Inquiry >> Dec 25, 2018  9:48 AM Lynne Logan D wrote: Reason for CRM: Pt has an appointment with another provider at 11 am today. She would like to know if she can come by the office and pick up a copy of her current medication list. Please advise. [phone]

## 2019-03-18 ENCOUNTER — Ambulatory Visit (INDEPENDENT_AMBULATORY_CARE_PROVIDER_SITE_OTHER): Payer: BC Managed Care – PPO

## 2019-03-18 ENCOUNTER — Other Ambulatory Visit: Payer: Self-pay

## 2019-03-18 ENCOUNTER — Ambulatory Visit: Payer: BC Managed Care – PPO | Admitting: Nurse Practitioner

## 2019-03-18 ENCOUNTER — Encounter: Payer: Self-pay | Admitting: Nurse Practitioner

## 2019-03-18 VITALS — BP 124/86 | HR 62 | Temp 98.4°F | Ht 65.0 in | Wt 221.0 lb

## 2019-03-18 DIAGNOSIS — M25561 Pain in right knee: Secondary | ICD-10-CM

## 2019-03-18 DIAGNOSIS — G8929 Other chronic pain: Secondary | ICD-10-CM | POA: Diagnosis not present

## 2019-03-18 DIAGNOSIS — M7989 Other specified soft tissue disorders: Secondary | ICD-10-CM | POA: Diagnosis not present

## 2019-03-18 MED ORDER — NABUMETONE 500 MG PO TABS: 500.0000 mg | ORAL_TABLET | Freq: Two times a day (BID) | ORAL | 1 refills | Status: DC | PRN

## 2019-03-18 NOTE — Patient Instructions (Addendum)
Normal knee x-ray Use relafen as needed for knee pain (with food) Continue use of knee sleeve. You will be contacted to schedule appt with sports medicine. She is aware of need to loss weight in order to minimize knee pain

## 2019-03-18 NOTE — Progress Notes (Signed)
Subjective:  Patient ID: Angela Schwartz, female    DOB: 1986/01/28  Age: 33 y.o. MRN: [id number]  CC: Knee Pain (pt is c/o of right knee pain/has arthritis for a long time/ took tylenol,ibuprofen and pensaid (break her out)--didnt help/ its comes and goes.FYI--pt is on her feet alot at work. )  Knee Pain  The incident occurred more than 1 week ago (over 96mos). There was no injury mechanism. The pain is present in the right knee. The quality of the pain is described as aching. The pain has been constant since onset. Pertinent negatives include no inability to bear weight, loss of motion, loss of sensation, muscle weakness, numbness or tingling. The symptoms are aggravated by movement and weight bearing. She has tried NSAIDs, rest, acetaminophen and immobilization for the symptoms. The treatment provided mild relief.  worse with weight gain  Reviewed past Medical, Social and Family history today.  Outpatient Medications Prior to Visit  Medication Sig Dispense Refill  . acetaminophen (TYLENOL) 500 MG tablet Take 500 mg by mouth every 6 (six) hours as needed for pain.    Marland Kitchen buPROPion (WELLBUTRIN XL) 150 MG 24 hr tablet TAKE 1 TABLET BY MOUTH EVERY DAY 30 tablet 1  . hydrocortisone 2.5 % cream Apply topically 2 (two) times daily. 30 g 0  . ketoconazole (NIZORAL) 2 % shampoo Apply 1 application topically 2 (two) times a week. To scalp 120 mL 0  . levonorgestrel (MIRENA) 20 MCG/24HR IUD 1 each by Intrauterine route once.    Marland Kitchen amoxicillin-clavulanate (AUGMENTIN) 875-125 MG tablet Take 1 tablet by mouth 2 (two) times daily. (Patient not taking: Reported on 03/18/2019) 20 tablet 0  . chlorhexidine (PERIDEX) 0.12 % solution Use as directed 15 mLs in the mouth or throat 2 (two) times daily. (Patient not taking: Reported on 03/18/2019) 300 mL 0   No facility-administered medications prior to visit.     ROS See HPI  Objective:  BP 124/86   Pulse 62   Temp 98.4 F (36.9 C) (Oral)   Ht 5\' 5"   (1.651 m)   Wt 221 lb (100.2 kg)   SpO2 98%   BMI 36.78 kg/m   BP Readings from Last 3 Encounters:  03/18/19 124/86  09/23/18 (!) 150/92  07/09/18 122/84    Wt Readings from Last 3 Encounters:  03/18/19 221 lb (100.2 kg)  12/10/18 224 lb 3.2 oz (101.7 kg)  09/23/18 220 lb 9.6 oz (100.1 kg)   Physical Exam Vitals signs reviewed.  Musculoskeletal:        General: Swelling and tenderness present.     Right knee: She exhibits swelling. She exhibits normal range of motion and no erythema. Tenderness found. Medial joint line and lateral joint line tenderness noted. No patellar tendon tenderness noted.     Right ankle: Normal.     Right upper leg: Normal.     Right lower leg: Normal. No edema.     Left lower leg: Normal. No edema.     Right foot: Normal.  Neurological:     Mental Status: She is alert and oriented to person, place, and time.    Lab Results  Component Value Date   WBC 8.0 07/09/2018   HGB 14.0 07/09/2018   HCT 40.5 07/09/2018   PLT 245.0 07/09/2018   GLUCOSE 93 07/09/2018   CHOL 154 07/09/2018   TRIG 65.0 07/09/2018   HDL 59.10 07/09/2018   LDLCALC 82 07/09/2018   ALT 13 07/09/2018   AST 25 07/09/2018  NA 140 07/09/2018   K 4.8 Hemolyzed 07/09/2018   CL 110 07/09/2018   CREATININE 0.85 07/09/2018   BUN 11 07/09/2018   CO2 20 07/09/2018   TSH 0.70 07/09/2018    Assessment & Plan:   Angela Schwartz was seen today for knee pain.  Diagnoses and all orders for this visit:  Chronic pain of right knee -     DG KNEE 3 VIEW RIGHT -     nabumetone (RELAFEN) 500 MG tablet; Take 1 tablet (500 mg total) by mouth 2 (two) times daily as needed (with food). -     Ambulatory referral to Sports Medicine   I am having Angela Schwartz start on nabumetone. I am also having her maintain her acetaminophen, levonorgestrel, ketoconazole, hydrocortisone, buPROPion, amoxicillin-clavulanate, and chlorhexidine.  Meds ordered this encounter  Medications  . nabumetone  (RELAFEN) 500 MG tablet    Sig: Take 1 tablet (500 mg total) by mouth 2 (two) times daily as needed (with food).    Dispense:  30 tablet    Refill:  1    Order Specific Question:   Supervising Provider    Answer:   Dianne DunARON, TALIA M [[id number]]    Problem List Items Addressed This Visit    None    Visit Diagnoses    Chronic pain of right knee    -  Primary   Relevant Medications   nabumetone (RELAFEN) 500 MG tablet   Other Relevant Orders   DG KNEE 3 VIEW RIGHT   Ambulatory referral to Sports Medicine      Follow-up: Return in about 4 months (around 07/18/2019) for CPE (fasting).  Alysia Pennaharlotte , NP

## 2019-03-24 ENCOUNTER — Other Ambulatory Visit: Payer: Self-pay

## 2019-03-24 ENCOUNTER — Encounter: Payer: Self-pay | Admitting: Family Medicine

## 2019-03-24 ENCOUNTER — Ambulatory Visit: Payer: Self-pay

## 2019-03-24 ENCOUNTER — Ambulatory Visit: Payer: BC Managed Care – PPO | Admitting: Family Medicine

## 2019-03-24 VITALS — BP 126/84 | HR 77 | Ht 65.0 in | Wt 226.0 lb

## 2019-03-24 DIAGNOSIS — M25561 Pain in right knee: Secondary | ICD-10-CM

## 2019-03-24 DIAGNOSIS — G8929 Other chronic pain: Secondary | ICD-10-CM

## 2019-03-24 DIAGNOSIS — M222X9 Patellofemoral disorders, unspecified knee: Secondary | ICD-10-CM | POA: Insufficient documentation

## 2019-03-24 MED ORDER — IBUPROFEN-FAMOTIDINE 800-26.6 MG PO TABS: 1.0000 | ORAL_TABLET | Freq: Three times a day (TID) | ORAL | 3 refills | Status: DC

## 2019-03-24 NOTE — Assessment & Plan Note (Signed)
Pain seems to be related to the meniscus.  No significant effusion however in the joint.  Possible be component of patellofemoral syndrome. -Counseled on Pennsaid.  Advised not to rub it and just apply. -Duexis and sample provided -Counseled on home exercise therapy and supportive care. -Hinged knee brace. -If no improvement will consider physical therapy, imaging or injection.

## 2019-03-24 NOTE — Progress Notes (Signed)
Medication Samples have been provided to the patient.  Drug name: Pennsaid      Strength: 2%        Qty: 1 Box  LOT: [id number]  Exp.Date: 09/2019  Dosing instructions: Use a peasize amount and rub gently  The patient has been instructed regarding the correct time, dose, and frequency of taking this medication, including desired effects and most common side effects.   Sherrie George, MA 3:16 PM 03/24/2019

## 2019-03-24 NOTE — Patient Instructions (Addendum)
Nice to meet you Please try ice  You can try the pennsaid again but don't rub it in. Just place it on the skin.  Please try the brace while working  Please try the exercises   Please send me a message in MyChart with any questions or updates.  Please see [place] back in 4 weeks.   --Dr. Raeford Razor

## 2019-03-24 NOTE — Progress Notes (Signed)
Angela Schwartz - 33 y.o. female MRN [id number]  Date of birth: Jun 27, 1986  SUBJECTIVE:  Including CC & ROS.  Chief Complaint  Patient presents with  . Knee Pain    right knee    Angela Schwartz is a 33 y.o. female that is presenting with right knee pain.  The pain is acute on chronic in nature.  She denies any specific inciting event.  It is worse after she works 3 long hour days in a row.  She has tried Pennsaid with limited improvement.  The pain is mild to severe.  It is sharp and stabbing.  Is localized to the knee.  Denies any mechanical symptoms.  Feels like the pain is currently staying the same.  Does have swelling from time to time.  Independent review of the right knee x-ray from 8/12 shows no significant abnormalities.   Review of Systems  Constitutional: Negative for fever.  HENT: Negative for congestion.   Respiratory: Negative for cough.   Cardiovascular: Negative for chest pain.  Gastrointestinal: Negative for abdominal pain.  Musculoskeletal: Positive for joint swelling.  Skin: Negative for color change.  Neurological: Negative for weakness.  Hematological: Negative for adenopathy.    HISTORY: Past Medical, Surgical, Social, and Family History Reviewed & Updated per EMR.   Pertinent Historical Findings include:  Past Medical History:  Diagnosis Date  . Anxiety   . Arthritis   . Depression   . Substance abuse [facility](HCC)     Past Surgical History:  Procedure Laterality Date  . CESAREAN SECTION    . MOUTH SURGERY      Allergies  Allergen Reactions  . Diclofenac Rash    Break out    Family History  Problem Relation Age of Onset  . Hypertension Mother   . Hyperlipidemia Mother   . Hypertension Father   . Hyperlipidemia Father   . Heart failure Father   . Diabetes Father   . Hypertension Brother   . Cancer Maternal Aunt        cervical     Social History   Socioeconomic History  . Marital status: Single    Spouse name: Not on file  .  Number of children: 1  . Years of education: Not on file  . Highest education level: Not on file  Occupational History  . 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  Social Needs  . Financial resource strain: Not on file  . Food insecurity    Worry: Not on file    Inability: Not on file  . Transportation needs    Medical: Not on file    Non-medical: Not on file  Tobacco Use  . Smoking status: Former Games developermoker  . Smokeless tobacco: Never Used  Substance and Sexual Activity  . Alcohol use: Yes    Comment: every weekend  . Drug use: Yes    Types: Marijuana    Comment: once in awhile  . Sexual activity: Yes    Birth control/protection: I.U.D.    Comment: Mirena- inserted 2016  Lifestyle  . Physical activity    Days per week: Not on file    Minutes per session: Not on file  . Stress: Not on file  Relationships  . Social Musicianconnections    Talks on phone: Not on file    Gets together: Not on file    Attends religious service: Not on file    Active member of club or organization: Not on file    Attends  meetings of clubs or organizations: Not on file    Relationship status: Not on file  . Intimate partner violence    Fear of current or ex partner: Not on file    Emotionally abused: Not on file    Physically abused: Not on file    Forced sexual activity: Not on file  Other Topics Concern  . Not on file  Social History Narrative  . Not on file     PHYSICAL EXAM:  VS: BP 126/84   Pulse 77   Ht 5\' 5"  (1.651 m)   Wt 226 lb (102.5 kg)   BMI 37.61 kg/m  Physical Exam Gen: NAD, alert, cooperative with exam, well-appearing ENT: normal lips, normal nasal mucosa,  Eye: normal EOM, normal conjunctiva and lids CV:  no edema, +2 pedal pulses   Resp: no accessory muscle use, non-labored,  Skin: no rashes, no areas of induration  Neuro: normal tone, normal sensation to touch Psych:  normal insight, alert and oriented MSK:  Right knee: No obvious effusion. No specific tenderness  of the medial lateral joint line. Normal range of motion. No instability with valgus or varus stress testing. Negative Murray's test. No pain with patellar grind. Neurovascular intact  Limited ultrasound: Right knee:  No obvious effusion within the suprapatellar pouch. Normal-appearing quad tendon. There appears to be a mild hypoechoic change at the proximal patellar tendon to suggest may be a bursitis.  No changes of the actual patellar tendon. Medial joint space with mild narrowing and mild outpouching of the medial meniscus to suggest degenerative changes. Lateral meniscus with significant outpouching to suggest more degenerative changes than compared to the medial aspect.  Summary: Findings suggestive of degenerative changes of the medial lateral meniscus  Ultrasound and interpretation by Clearance Coots, MD      ASSESSMENT & PLAN:   Chronic pain of right knee Pain seems to be related to the meniscus.  No significant effusion however in the joint.  Possible be component of patellofemoral syndrome. -Counseled on Pennsaid.  Advised not to rub it and just apply. -Duexis and sample provided -Counseled on home exercise therapy and supportive care. -Hinged knee brace. -If no improvement will consider physical therapy, imaging or injection.

## 2019-04-21 ENCOUNTER — Encounter: Payer: Self-pay | Admitting: Family Medicine

## 2019-04-21 ENCOUNTER — Ambulatory Visit: Payer: BC Managed Care – PPO | Admitting: Family Medicine

## 2019-04-21 ENCOUNTER — Other Ambulatory Visit: Payer: Self-pay

## 2019-04-21 DIAGNOSIS — M222X1 Patellofemoral disorders, right knee: Secondary | ICD-10-CM

## 2019-04-21 DIAGNOSIS — N9089 Other specified noninflammatory disorders of vulva and perineum: Secondary | ICD-10-CM | POA: Diagnosis not present

## 2019-04-21 DIAGNOSIS — A6004 Herpesviral vulvovaginitis: Secondary | ICD-10-CM | POA: Diagnosis not present

## 2019-04-21 NOTE — Patient Instructions (Signed)
Good to see you Please continue the exercises  Please use ice as needed  Please use the brace as needed  Good luck with purse shopping  Please send me a message in MyChart with any questions or updates.  Please see me back in 2-3 months or sooner if needed.   --Dr. Raeford Razor

## 2019-04-21 NOTE — Progress Notes (Signed)
Angela Schwartz - 33 y.o. female MRN [id number]  Date of birth: 07-28-1986  SUBJECTIVE:  Including CC & ROS.  Chief Complaint  Patient presents with  . Follow-up    follow up for right knee    Angela SituJennifer Nicole Angela Schwartz is a 33 y.o. female that is following up for her right knee pain.  Has tried some of the home exercise medications and feels like the pain is still the same.  The pain is over the anterior aspect.  Denies any mechanical symptoms.  The pain does seem to be worse after the 3 days of her shift.  Has not tried a brace.  Pain can be sharp and stabbing.  Pain can be moderate to severe.  Seems to be localized to the knee.  Does seem to swell intermittently.    Review of Systems  Constitutional: Negative for fever.  HENT: Negative for congestion.   Respiratory: Negative for cough.   Cardiovascular: Negative for chest pain.  Gastrointestinal: Negative for abdominal pain.  Musculoskeletal: Positive for joint swelling.  Skin: Negative for color change.  Neurological: Negative for weakness.  Hematological: Negative for adenopathy.    HISTORY: Past Medical, Surgical, Social, and Family History Reviewed & Updated per EMR.   Pertinent Historical Findings include:  Past Medical History:  Diagnosis Date  . Anxiety   . Arthritis   . Depression   . Substance abuse [facility](HCC)     Past Surgical History:  Procedure Laterality Date  . CESAREAN SECTION    . MOUTH SURGERY      Allergies  Allergen Reactions  . Diclofenac Rash    Break out    Family History  Problem Relation Age of Onset  . Hypertension Mother   . Hyperlipidemia Mother   . Hypertension Father   . Hyperlipidemia Father   . Heart failure Father   . Diabetes Father   . Hypertension Brother   . Cancer Maternal Aunt        cervical     Social History   Socioeconomic History  . Marital status: Single    Spouse name: Not on file  . Number of children: 1  . Years of education: Not on file  . Highest  education level: Not on file  Occupational History  . 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  Social Needs  . Financial resource strain: Not on file  . Food insecurity    Worry: Not on file    Inability: Not on file  . Transportation needs    Medical: Not on file    Non-medical: Not on file  Tobacco Use  . Smoking status: Former Games developermoker  . Smokeless tobacco: Never Used  Substance and Sexual Activity  . Alcohol use: Yes    Comment: every weekend  . Drug use: Yes    Types: Marijuana    Comment: once in awhile  . Sexual activity: Yes    Birth control/protection: I.U.D.    Comment: Mirena- inserted 2016  Lifestyle  . Physical activity    Days per week: Not on file    Minutes per session: Not on file  . Stress: Not on file  Relationships  . Social Musicianconnections    Talks on phone: Not on file    Gets together: Not on file    Attends religious service: Not on file    Active member of club or organization: Not on file    Attends meetings of clubs or organizations: Not on file  Relationship status: Not on file  . Intimate partner violence    Fear of current or ex partner: Not on file    Emotionally abused: Not on file    Physically abused: Not on file    Forced sexual activity: Not on file  Other Topics Concern  . Not on file  Social History Narrative  . Not on file     PHYSICAL EXAM:  VS: Ht 5\' 5"  (1.651 m)   Wt 219 lb (99.3 kg)   BMI 36.44 kg/m  Physical Exam Gen: NAD, alert, cooperative with exam, well-appearing ENT: normal lips, normal nasal mucosa,  Eye: normal EOM, normal conjunctiva and lids CV:  no edema, +2 pedal pulses   Resp: no accessory muscle use, non-labored,  Skin: no rashes, no areas of induration  Neuro: normal tone, normal sensation to touch Psych:  normal insight, alert and oriented MSK:  Right knee: No obvious effusion. No significant tenderness over the quad or patellar tendon.  Some tenderness palpation over the medial joint line.  Normal range of motion. No instability with valgus or varus stress testing. Negative McMurray test. Neurovascular intact     ASSESSMENT & PLAN:   Patellofemoral pain syndrome Pain seems to be about the same but not significant.  More likely to be patellofemoral in nature. -Counseled on home exercise therapy and supportive care. -Provided with a brace. -If no improvement can consider physical therapy.

## 2019-04-21 NOTE — Assessment & Plan Note (Addendum)
Pain seems to be about the same but not significant.  More likely to be patellofemoral in nature. -Counseled on home exercise therapy and supportive care. -Provided with a brace. -If no improvement can consider physical therapy.

## 2019-05-13 DIAGNOSIS — Z30433 Encounter for removal and reinsertion of intrauterine contraceptive device: Secondary | ICD-10-CM | POA: Diagnosis not present

## 2019-05-13 DIAGNOSIS — Z1389 Encounter for screening for other disorder: Secondary | ICD-10-CM | POA: Diagnosis not present

## 2019-05-13 DIAGNOSIS — Z6837 Body mass index (BMI) 37.0-37.9, adult: Secondary | ICD-10-CM | POA: Diagnosis not present

## 2019-05-13 DIAGNOSIS — Z01419 Encounter for gynecological examination (general) (routine) without abnormal findings: Secondary | ICD-10-CM | POA: Diagnosis not present

## 2019-05-13 DIAGNOSIS — Z130 Encounter for screening for diseases of the blood and blood-forming organs and certain disorders involving the immune mechanism: Secondary | ICD-10-CM | POA: Diagnosis not present

## 2019-06-11 ENCOUNTER — Other Ambulatory Visit: Payer: Self-pay

## 2019-06-11 DIAGNOSIS — Z20822 Contact with and (suspected) exposure to covid-19: Secondary | ICD-10-CM

## 2019-06-12 LAB — NOVEL CORONAVIRUS, NAA: SARS-CoV-2, NAA: NOT DETECTED

## 2019-07-17 ENCOUNTER — Other Ambulatory Visit: Payer: Self-pay | Admitting: Family Medicine

## 2019-09-25 ENCOUNTER — Encounter: Payer: Self-pay | Admitting: Family Medicine

## 2019-09-25 ENCOUNTER — Telehealth (INDEPENDENT_AMBULATORY_CARE_PROVIDER_SITE_OTHER): Payer: Self-pay | Admitting: Family Medicine

## 2019-09-25 VITALS — Temp 98.2°F | Ht 65.0 in | Wt 218.3 lb

## 2019-09-25 DIAGNOSIS — B029 Zoster without complications: Secondary | ICD-10-CM

## 2019-09-25 DIAGNOSIS — L209 Atopic dermatitis, unspecified: Secondary | ICD-10-CM

## 2019-09-25 MED ORDER — VALACYCLOVIR HCL 1 G PO TABS: 1000.0000 mg | ORAL_TABLET | Freq: Three times a day (TID) | ORAL | 0 refills | Status: AC

## 2019-09-25 MED ORDER — KETOCONAZOLE 2 % EX SHAM: 1.0000 "application " | MEDICATED_SHAMPOO | CUTANEOUS | 0 refills | Status: DC

## 2019-09-25 NOTE — Progress Notes (Signed)
Virtual Visit via Video Note  I connected with Angela Schwartz on 09/25/19 at 11:30 AM EST by a video enabled telemedicine application and verified that I am speaking with the correct person using two identifiers. Location patient: home Location provider: work  Persons participating in the virtual visit: patient, provider  I discussed the limitations of evaluation and management by telemedicine and the availability of in person appointments. The patient expressed understanding and agreed to proceed.  Chief Complaint  Patient presents with  . Rash    Pt c/o rash on mid back area, x 3day.  Started out itchy, but now red and hot touch along with blisters.     HPI: Angela Schwartz is a 34 y.o. female who is a patient of my colleague Wilfred Lacy who complains of itchy sensation on her back 2 nights ago. By last night area was larger, warm to touch. This AM rash is itchy and tingling.  She applied tea tree oil yesterday. Pt had shingles in 12/2017 in exact same location and same symptoms.   She also requests a refill of ketoconazole shampoo which she uses 2x/wk.    Past Medical History:  Diagnosis Date  . Anxiety   . Arthritis   . Depression   . Substance abuse [facility])     Past Surgical History:  Procedure Laterality Date  . CESAREAN SECTION    . MOUTH SURGERY      Family History  Problem Relation Age of Onset  . Hypertension Mother   . Hyperlipidemia Mother   . Hypertension Father   . Hyperlipidemia Father   . Heart failure Father   . Diabetes Father   . Hypertension Brother   . Cancer Maternal Aunt        cervical    Social History   Tobacco Use  . Smoking status: Former Research scientist (life sciences)  . Smokeless tobacco: Never Used  Substance Use Topics  . Alcohol use: Yes    Comment: every weekend  . Drug use: Yes    Types: Marijuana    Comment: once in awhile     Current Outpatient Medications:  .  acetaminophen (TYLENOL) 500 MG tablet, Take 500 mg by mouth every 6  (six) hours as needed for pain., Disp: , Rfl:  .  hydrocortisone 2.5 % cream, Apply topically 2 (two) times daily., Disp: 30 g, Rfl: 0 .  Ibuprofen-Famotidine 800-26.6 MG TABS, Take 1 tablet by mouth 3 (three) times daily., Disp: 90 tablet, Rfl: 3 .  ketoconazole (NIZORAL) 2 % shampoo, Apply 1 application topically 2 (two) times a week. To scalp, Disp: 120 mL, Rfl: 0 .  levonorgestrel (MIRENA) 20 MCG/24HR IUD, 1 each by Intrauterine route once., Disp: , Rfl:  .  buPROPion (WELLBUTRIN XL) 150 MG 24 hr tablet, TAKE 1 TABLET BY MOUTH EVERY DAY (Patient not taking: Reported on 09/25/2019), Disp: 30 tablet, Rfl: 1 .  nabumetone (RELAFEN) 500 MG tablet, Take 1 tablet (500 mg total) by mouth 2 (two) times daily as needed (with food). (Patient not taking: Reported on 09/25/2019), Disp: 30 tablet, Rfl: 1  Allergies  Allergen Reactions  . Diclofenac Rash    Break out      ROS: See pertinent positives and negatives per HPI.   EXAM:  VITALS per patient if applicable: Temp 77.4 F (36.8 C) (Temporal)   Ht 5\' 5"  (1.651 m)   Wt 218 lb 4.8 oz (99 kg)   LMP 09/09/2019   BMI 36.33 kg/m    GENERAL:  alert, oriented, appears well and in no acute distress  NECK: normal movements of the head and neck  LUNGS: on inspection no signs of respiratory distress, breathing rate appears normal, no obvious gross SOB, gasping or wheezing, no conversational dyspnea  CV: no obvious cyanosis  SKIN: vesicular rash with in linear pattern with surrounding erythema on Rt mid back just lateral to midline  PSYCH/NEURO: pleasant and cooperative, no obvious depression or anxiety, speech and thought processing grossly intact   ASSESSMENT AND PLAN:  1. Atopic dermatitis, unspecified type Refill: - ketoconazole (NIZORAL) 2 % shampoo; Apply 1 application topically 2 (two) times a week. To scalp  Dispense: 120 mL; Refill: 0  2. Herpes zoster without complication - symptom onset less than 48 hrs ago Rx: -  valACYclovir (VALTREX) 1000 MG tablet; Take 1 tablet (1,000 mg total) by mouth 3 (three) times daily for 7 days.  Dispense: 21 tablet; Refill: 0 - capsaicin cream, domeboro solution compress PRN - f/u PRN Discussed plan and reviewed medications with patient, including risks, benefits, and potential side effects. Pt expressed understand. All questions answered.    I discussed the assessment and treatment plan with the patient. The patient was provided an opportunity to ask questions and all were answered. The patient agreed with the plan and demonstrated an understanding of the instructions.   The patient was advised to call back or seek an in-person evaluation if the symptoms worsen or if the condition fails to improve as anticipated.   Luana Shu, DO

## 2019-09-25 NOTE — Patient Instructions (Addendum)
Health Maintenance Due  Topic Date Due  . HIV Screening  02/23/1999  . INFLUENZA VACCINE  03/07/2019   Take valacyclovir 1000mg  1 tab 3x/day x 7 days Can use OTC capsaicin cream 3-4x/day as needed and also Domeboro solution compress 3-4x/day as needed Follow-up if symptoms worsen or do not improve in 2 wks

## 2019-10-17 ENCOUNTER — Other Ambulatory Visit: Payer: Self-pay | Admitting: Family Medicine

## 2019-10-17 DIAGNOSIS — L209 Atopic dermatitis, unspecified: Secondary | ICD-10-CM

## 2019-10-19 NOTE — Telephone Encounter (Signed)
Last OV 09/25/19 Last fill 09/28/19 #160ml

## 2019-10-23 DIAGNOSIS — Z20828 Contact with and (suspected) exposure to other viral communicable diseases: Secondary | ICD-10-CM | POA: Diagnosis not present

## 2019-11-10 DIAGNOSIS — E785 Hyperlipidemia, unspecified: Secondary | ICD-10-CM | POA: Diagnosis not present

## 2019-11-10 DIAGNOSIS — Z720 Tobacco use: Secondary | ICD-10-CM | POA: Diagnosis not present

## 2019-11-10 DIAGNOSIS — E559 Vitamin D deficiency, unspecified: Secondary | ICD-10-CM | POA: Diagnosis not present

## 2020-01-13 IMAGING — DX RIGHT KNEE - 3 VIEW
3 series · 3 of 3 positions shown · non-contrast
Comparison: None.

CLINICAL DATA: Chronic right knee pain and swelling for 6 months.
No reported injury.

EXAM:
RIGHT KNEE - 3 VIEW

[knee ap]
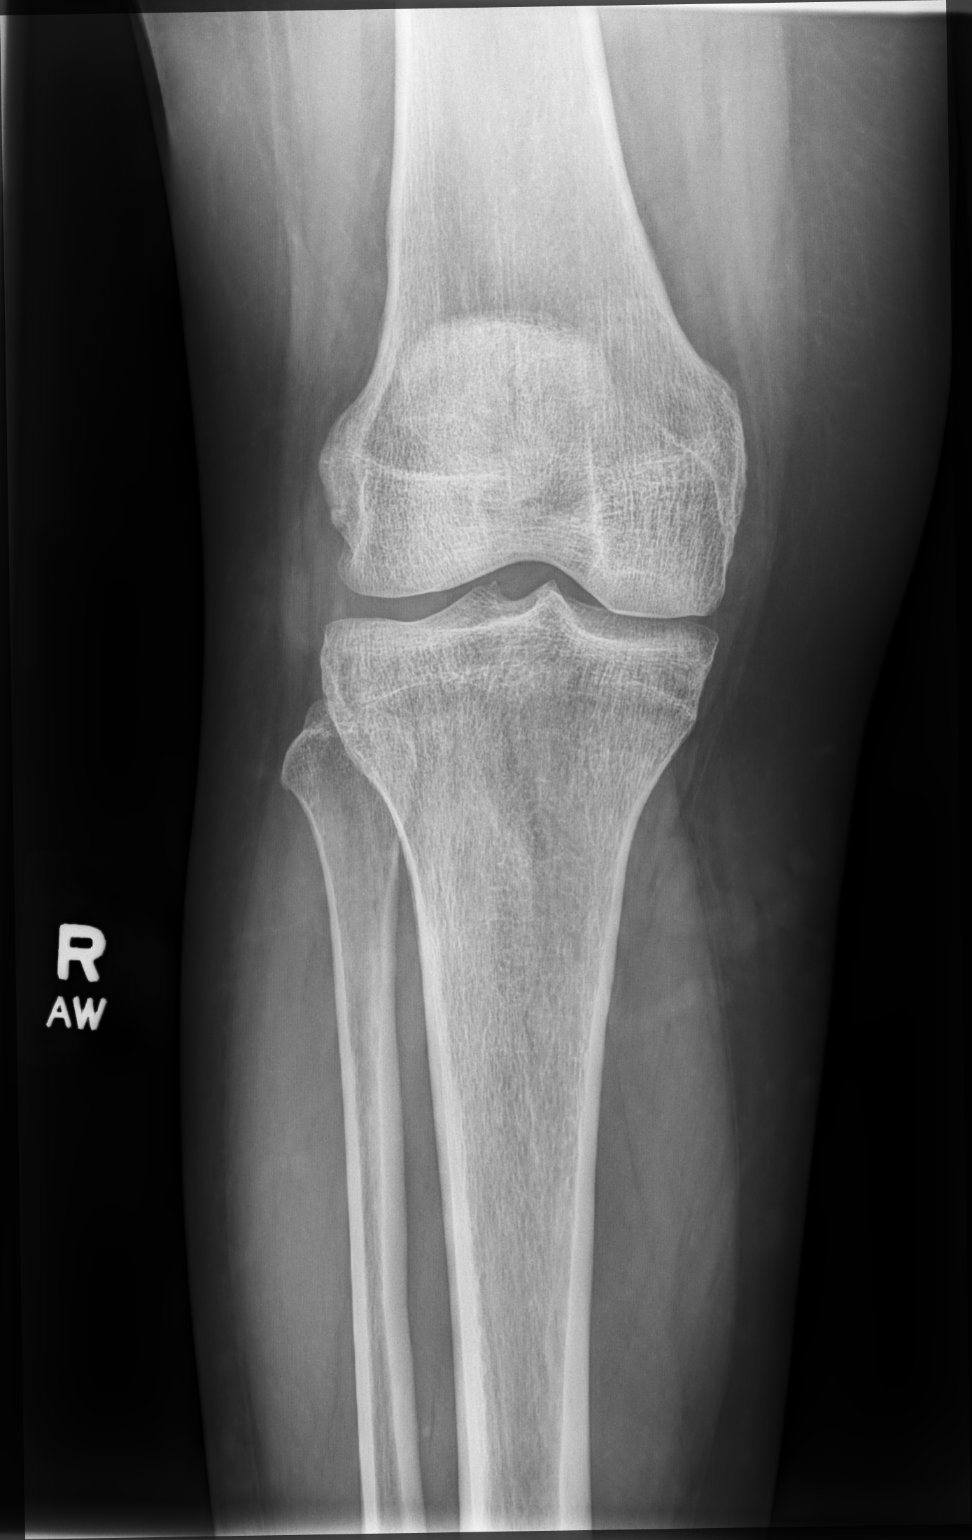

[knee lat]
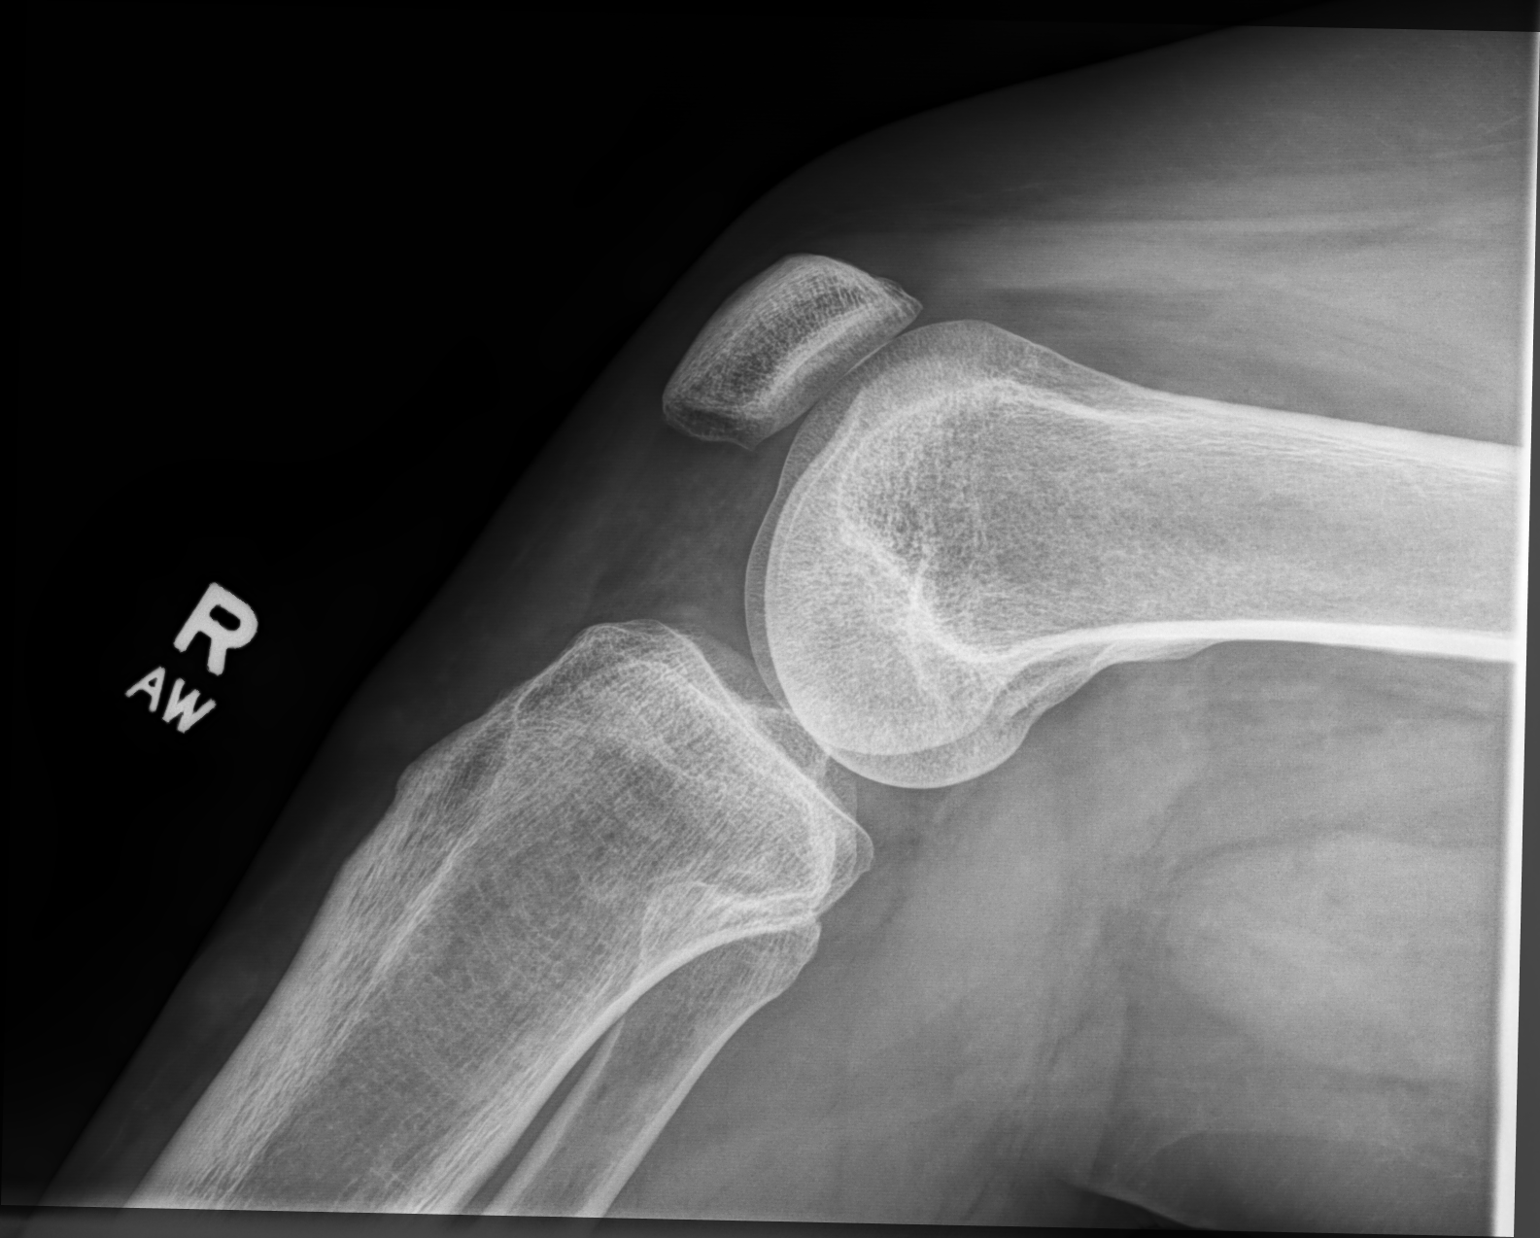

[patella (sunrise)]
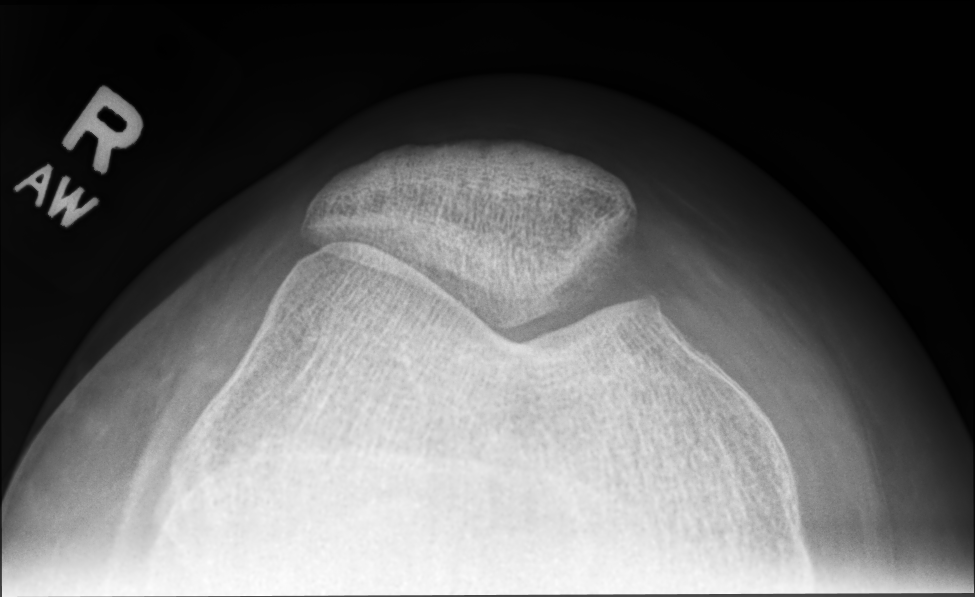

[3 of 3 positions shown; findings below may reference images not displayed]

FINDINGS: No evidence of fracture, dislocation, or joint effusion. No evidence
of arthropathy or other focal bone abnormality. Soft tissues are
unremarkable.
IMPRESSION: No acute osseous abnormality.  No significant arthropathy.

## 2020-05-18 DIAGNOSIS — Z130 Encounter for screening for diseases of the blood and blood-forming organs and certain disorders involving the immune mechanism: Secondary | ICD-10-CM | POA: Diagnosis not present

## 2020-05-18 DIAGNOSIS — Z124 Encounter for screening for malignant neoplasm of cervix: Secondary | ICD-10-CM | POA: Diagnosis not present

## 2020-05-18 DIAGNOSIS — R82998 Other abnormal findings in urine: Secondary | ICD-10-CM | POA: Diagnosis not present

## 2020-05-18 DIAGNOSIS — Z01419 Encounter for gynecological examination (general) (routine) without abnormal findings: Secondary | ICD-10-CM | POA: Diagnosis not present

## 2020-05-18 DIAGNOSIS — Z1151 Encounter for screening for human papillomavirus (HPV): Secondary | ICD-10-CM | POA: Diagnosis not present

## 2020-08-18 ENCOUNTER — Telehealth (INDEPENDENT_AMBULATORY_CARE_PROVIDER_SITE_OTHER): Payer: BC Managed Care – PPO | Admitting: Family Medicine

## 2020-08-18 DIAGNOSIS — R238 Other skin changes: Secondary | ICD-10-CM | POA: Diagnosis not present

## 2020-08-18 MED ORDER — VALACYCLOVIR HCL 1 G PO TABS: 1000.0000 mg | ORAL_TABLET | Freq: Three times a day (TID) | ORAL | 0 refills | Status: DC

## 2020-08-18 NOTE — Progress Notes (Signed)
Virtual Visit via Video Note  I connected with Ilya  on 08/18/20 at 12:00 PM EST by a video enabled telemedicine application and verified that I am speaking with the correct person using two identifiers.  Location patient: home, [place] Location provider:work or home office Persons participating in the virtual visit: patient, provider  I discussed the limitations of evaluation and management by telemedicine and the availability of in person appointments. The patient expressed understanding and agreed to proceed.   HPI:  Acute telemedicine visit for a skin rash that she feels is shingles: -Onset: 2-3 days ago -Symptoms include: started with itchy bump on back, then developed blisters in a cluster on the upper R back, now painful -reports has had shingles in this same spot in the past and she feels that this looks and feels the same -Denies:rash anywhere else, fevers, malaise or symptoms anywhere else  ROS: See pertinent positives and negatives per HPI.  Past Medical History:  Diagnosis Date  . Anxiety   . Arthritis   . Depression   . Substance abuse [facility])     Past Surgical History:  Procedure Laterality Date  . CESAREAN SECTION    . MOUTH SURGERY       Current Outpatient Medications:  .  valACYclovir (VALTREX) 1000 MG tablet, Take 1 tablet (1,000 mg total) by mouth 3 (three) times daily., Disp: 21 tablet, Rfl: 0 .  acetaminophen (TYLENOL) 500 MG tablet, Take 500 mg by mouth every 6 (six) hours as needed for pain., Disp: , Rfl:  .  buPROPion (WELLBUTRIN XL) 150 MG 24 hr tablet, TAKE 1 TABLET BY MOUTH EVERY DAY (Patient not taking: Reported on 09/25/2019), Disp: 30 tablet, Rfl: 1 .  hydrocortisone 2.5 % cream, Apply topically 2 (two) times daily., Disp: 30 g, Rfl: 0 .  Ibuprofen-Famotidine 800-26.6 MG TABS, Take 1 tablet by mouth 3 (three) times daily., Disp: 90 tablet, Rfl: 3 .  ketoconazole (NIZORAL) 2 % shampoo, APPLY 1 APPLICATION TOPICALLY 2 (TWO) TIMES A WEEK. TO SCALP,  Disp: 120 mL, Rfl: 0 .  levonorgestrel (MIRENA) 20 MCG/24HR IUD, 1 each by Intrauterine route once., Disp: , Rfl:  .  nabumetone (RELAFEN) 500 MG tablet, Take 1 tablet (500 mg total) by mouth 2 (two) times daily as needed (with food). (Patient not taking: Reported on 09/25/2019), Disp: 30 tablet, Rfl: 1  EXAM:  VITALS per patient if applicable:  GENERAL: alert, oriented, appears well and in no acute distress  HEENT: atraumatic, conjunttiva clear, no obvious abnormalities on inspection of external nose and ears  NECK: normal movements of the head and neck  LUNGS: on inspection no signs of respiratory distress, breathing rate appears normal, no obvious gross SOB, gasping or wheezing  CV: no obvious cyanosis  Skin: small cluster of vesicular lesions on R upper back  MS: moves all visible extremities without noticeable abnormality  PSYCH/NEURO: pleasant and cooperative, no obvious depression or anxiety, speech and thought processing grossly intact  ASSESSMENT AND PLAN:  Discussed the following assessment and plan:  Vesicular rash  -we discussed possible serious and likely etiologies, options for evaluation and workup, limitations of telemedicine visit vs in person visit, treatment, treatment risks and precautions. Pt prefers to treat via telemedicine empirically rather than in person at this moment.  Query shingles, herpes versus other.  Given recurrent in the same location, likely zoster or herpes.  Opted to treat with Valtrex 1 g 3 times a day for 7 days given the current crisis situation with the  COVID pandemic.  Advised ideally, it would be good if she could do an in person visit this occurs again for a viral culture.  Currently there are no appointments available at her PCP office and urgent cares are overrun. Scheduled follow up with PCP offered: Increased contact PCP office for follow-up if needed. Advised to seek prompt in person care if worsening, new symptoms arise, or if is not  improving with treatment. Discussed options for inperson care if PCP office not available. Did let this patient know that I only do telemedicine on Tuesdays and Thursdays for [place]. Advised to schedule follow up visit with PCP or U[facility] if any further questions or concerns to avoid delays in care.   I discussed the assessment and treatment plan with the patient. The patient was provided an opportunity to ask questions and all were answered. The patient agreed with the plan and demonstrated an understanding of the instructions.     Terressa Koyanagi, DO

## 2020-08-18 NOTE — Patient Instructions (Signed)
-  I sent the medication(s) we discussed to your pharmacy: Meds ordered this encounter  Medications  . valACYclovir (VALTREX) 1000 MG tablet    Sig: Take 1 tablet (1,000 mg total) by mouth 3 (three) times daily.    Dispense:  21 tablet    Refill:  0     I hope you are feeling better soon!  Seek in person care promptly if your symptoms worsen, new concerns arise or you are not improving with treatment.  It was nice to meet you today. I help [facility] out with telemedicine visits on Tuesdays and Thursdays and am available for visits on those days. If you have any concerns or questions following this visit please schedule a follow up visit with your Primary Care doctor or seek care at a local urgent care clinic to avoid delays in care.

## 2020-11-24 ENCOUNTER — Other Ambulatory Visit (HOSPITAL_COMMUNITY)
Admission: RE | Admit: 2020-11-24 | Discharge: 2020-11-24 | Disposition: A | Discharge status: Home / Self Care | Payer: BC Managed Care – PPO | Source: Ambulatory Visit | Attending: Nurse Practitioner | Admitting: Nurse Practitioner

## 2020-11-24 ENCOUNTER — Other Ambulatory Visit: Payer: Self-pay

## 2020-11-24 ENCOUNTER — Ambulatory Visit: Payer: BC Managed Care – PPO | Admitting: Nurse Practitioner

## 2020-11-24 ENCOUNTER — Encounter: Payer: Self-pay | Admitting: Nurse Practitioner

## 2020-11-24 VITALS — BP 122/76 | HR 90 | Temp 99.2°F | Ht 65.0 in | Wt 225.2 lb

## 2020-11-24 DIAGNOSIS — Z0001 Encounter for general adult medical examination with abnormal findings: Secondary | ICD-10-CM | POA: Insufficient documentation

## 2020-11-24 DIAGNOSIS — N760 Acute vaginitis: Secondary | ICD-10-CM

## 2020-11-24 DIAGNOSIS — Z113 Encounter for screening for infections with a predominantly sexual mode of transmission: Secondary | ICD-10-CM | POA: Insufficient documentation

## 2020-11-24 DIAGNOSIS — Z124 Encounter for screening for malignant neoplasm of cervix: Secondary | ICD-10-CM

## 2020-11-24 DIAGNOSIS — Z136 Encounter for screening for cardiovascular disorders: Secondary | ICD-10-CM

## 2020-11-24 DIAGNOSIS — F3341 Major depressive disorder, recurrent, in partial remission: Secondary | ICD-10-CM | POA: Diagnosis not present

## 2020-11-24 DIAGNOSIS — Z13220 Encounter for screening for lipoid disorders: Secondary | ICD-10-CM | POA: Diagnosis not present

## 2020-11-24 DIAGNOSIS — B9689 Other specified bacterial agents as the cause of diseases classified elsewhere: Secondary | ICD-10-CM

## 2020-11-24 NOTE — Progress Notes (Signed)
Subjective:    Patient ID: Angela Schwartz, female    DOB: 1986-01-14, 35 y.o.   MRN: [id number]  Patient presents today for CPE and eval eval of chronic condition  HPI Depression, major, single episode, severe (HCC) Chronic, waxing and waning. Has difficulty with medication compliance (prozac and wellbutrin) Denies need for medication at this time. Agreed to psychology referral  Sexual History (orientation,birth control, marital status, STD):female partner, IUD in place, needs pelvic and breast exam, agreed to STD screen today  Depression/Suicide: Depression screen [facility] 2/9 11/24/2020 07/09/2018 02/04/2018 10/15/2017 10/15/2017  Decreased Interest 1 3 2 2 1   Down, Depressed, Hopeless 1 3 2 2 1   PHQ - 2 Score 2 6 4 4 2   Altered sleeping 3 3 2 3  -  Tired, decreased energy 2 2 1 3  -  Change in appetite 3 3 1 3  -  Feeling bad or failure about yourself  1 2 0 0 -  Trouble concentrating 0 1 0 1 -  Moving slowly or fidgety/restless 0 2 1 1  -  Suicidal thoughts 0 0 0 0 -  PHQ-9 Score 11 19 9 15  -  Difficult doing work/chores Somewhat difficult Very difficult - - -   GAD 7 : Generalized Anxiety Score 11/24/2020 07/09/2018 02/04/2018 10/15/2017  Nervous, Anxious, on Edge 1 3 1 2   Control/stop worrying 2 3 2 1   Worry too much - different things 2 3 2 1   Trouble relaxing 2 3 2 1   Restless 1 2 1 2   Easily annoyed or irritable 3 3 3 3   Afraid - awful might happen 0 0 0 0  Total GAD 7 Score 11 17 11 10   Anxiety Difficulty Somewhat difficult Very difficult - -   Vision:up to date  Dental:up to date  Immunizations: (TDAP, Hep C screen, Pneumovax, Influenza, zoster)  Health Maintenance  Topic Date Due  .  Hepatitis C: One time screening is recommended by Center for Disease Control  (CDC) for  adults born from [birth year] through 1967.   Never done  . HIV Screening  Never done  . Pap Smear  07/10/2020  . COVID-19 Vaccine (3 - Booster for Pfizer series) 10/26/2020  . Flu Shot  03/06/2021  .  Tetanus Vaccine  08/21/2023  . HPV Vaccine  Aged Out   Diet:regular Exercise: none Weight:  Wt Readings from Last 3 Encounters:  11/24/20 225 lb 3.2 oz (102.2 kg)  09/25/19 218 lb 4.8 oz (99 kg)  04/21/19 219 lb (99.3 kg)   Medications and allergies reviewed with patient and updated if appropriate.  Patient Active Problem List   Diagnosis Date Noted  . Patellofemoral pain syndrome 03/24/2019  . Hx of herpes genitalis 07/09/2018  . Atopic dermatitis 02/04/2018  . Depression, major, single episode, severe (HCC) 10/15/2017    Current Outpatient Medications on File Prior to Visit  Medication Sig Dispense Refill  . Ibuprofen-Famotidine 800-26.6 MG TABS Take 1 tablet by mouth 3 (three) times daily. 90 tablet 3  . levonorgestrel (MIRENA) 20 MCG/24HR IUD 1 each by Intrauterine route once.    . valACYclovir (VALTREX) 1000 MG tablet Take 1 tablet (1,000 mg total) by mouth 3 (three) times daily. 21 tablet 0   No current facility-administered medications on file prior to visit.    Past Medical History:  Diagnosis Date  . Anxiety   . Arthritis   . Depression   . Substance abuse [facility])     Past Surgical History:  Procedure Laterality Date  .  CESAREAN SECTION    . MOUTH SURGERY      Social History   Socioeconomic History  . Marital status: Single    Spouse name: Not on file  . Number of children: 1  . Years of education: Not on file  . Highest education level: Not on file  Occupational History  . 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  Tobacco Use  . Smoking status: Current Some Day Smoker    Types: Cigarettes  . Smokeless tobacco: Never Used  Vaping Use  . Vaping Use: Never used  Substance and Sexual Activity  . Alcohol use: Yes    Comment: every weekend  . Drug use: Yes    Types: Marijuana    Comment: once in awhile  . Sexual activity: Yes    Birth control/protection: I.U.D.    Comment: Mirena- inserted 2016  Other Topics Concern  . Not on file  Social  History Narrative  . Not on file   Social Determinants of Health   Financial Resource Strain: Not on file  Food Insecurity: Not on file  Transportation Needs: Not on file  Physical Activity: Not on file  Stress: Not on file  Social Connections: Not on file   Family History  Problem Relation Age of Onset  . Hypertension Mother   . Hyperlipidemia Mother   . Hypertension Father   . Hyperlipidemia Father   . Heart failure Father   . Diabetes Father   . Hypertension Brother   . Cancer Maternal Aunt        cervical       Review of Systems  Constitutional: Negative for fever, malaise/fatigue and weight loss.  HENT: Negative for congestion and sore throat.   Eyes:       Negative for visual changes  Respiratory: Negative for cough and shortness of breath.   Cardiovascular: Negative for chest pain, palpitations and leg swelling.  Gastrointestinal: Negative for blood in stool, constipation, diarrhea and heartburn.  Genitourinary: Negative for dysuria, frequency and urgency.  Musculoskeletal: Negative for falls, joint pain and myalgias.  Skin: Negative for rash.  Neurological: Negative for dizziness, sensory change and headaches.  Endo/Heme/Allergies: Does not bruise/bleed easily.  Psychiatric/Behavioral: Negative for depression, substance abuse and suicidal ideas. The patient is not nervous/anxious.    Objective:   Vitals:   11/24/20 1430  BP: 122/76  Pulse: 90  Temp: 99.2 F (37.3 C)  SpO2: 98%   Body mass index is 37.48 kg/m.  Physical Examination:  Physical Exam Vitals reviewed. Exam conducted with a chaperone present.  Constitutional:      General: She is not in acute distress.    Appearance: She is well-developed. She is obese.  HENT:     Right Ear: Tympanic membrane, ear canal and external ear normal.     Left Ear: Tympanic membrane, ear canal and external ear normal.  Eyes:     Extraocular Movements: Extraocular movements intact.     Conjunctiva/sclera:  Conjunctivae normal.  Cardiovascular:     Rate and Rhythm: Normal rate and regular rhythm.     Pulses: Normal pulses.     Heart sounds: Normal heart sounds.  Pulmonary:     Effort: Pulmonary effort is normal. No respiratory distress.     Breath sounds: Normal breath sounds.  Chest:     Chest wall: No tenderness.  Breasts:     Right: Normal. No axillary adenopathy or supraclavicular adenopathy.     Left: Normal. No axillary adenopathy or  supraclavicular adenopathy.    Abdominal:     General: Bowel sounds are normal.     Palpations: Abdomen is soft.     Hernia: There is no hernia in the left inguinal area or right inguinal area.  Genitourinary:    General: Normal vulva.     Labia:        Right: No rash, tenderness or lesion.        Left: No rash, tenderness or lesion.      Vagina: No vaginal discharge.     Cervix: Normal.     Uterus: Normal.      Adnexa: Right adnexa normal and left adnexa normal.  Musculoskeletal:        General: Normal range of motion.     Cervical back: Normal range of motion and neck supple.     Right lower leg: No edema.     Left lower leg: No edema.  Lymphadenopathy:     Cervical: No cervical adenopathy.     Upper Body:     Right upper body: No supraclavicular, axillary or pectoral adenopathy.     Left upper body: No supraclavicular, axillary or pectoral adenopathy.     Lower Body: No right inguinal adenopathy. No left inguinal adenopathy.  Skin:    General: Skin is warm and dry.  Neurological:     Mental Status: She is alert and oriented to person, place, and time.     Deep Tendon Reflexes: Reflexes are normal and symmetric.  Psychiatric:        Behavior: Behavior normal.        Thought Content: Thought content normal.    ASSESSMENT and PLAN: This visit occurred during the SARS-CoV-2 public health emergency.  Safety protocols were in place, including screening questions prior to the visit, additional usage of staff PPE, and extensive cleaning of  exam room while observing appropriate contact time as indicated for disinfecting solutions.   Brehanna was seen today for follow-up.  Diagnoses and all orders for this visit:  Encounter for preventative adult health care exam with abnormal findings -     CBC with Differential/Platelet; Future -     Comprehensive metabolic panel; Future -     Lipid panel; Future -     TSH; Future -     Cytology - PAP( [place])  Recurrent major depressive disorder, in partial remission (HCC) -     Ambulatory referral to Psychology  Encounter for lipid screening for cardiovascular disease -     Lipid panel; Future  Encounter for Papanicolaou smear for cervical cancer screening -     Cytology - PAP( [place])  Screen for STD (sexually transmitted disease) -     HIV antibody (with reflex) -     RPR; Future -     Cervicovaginal ancillary only( [place]) -     Hepatitis C Antibody; Future      Problem List Items Addressed This Visit      Other   Depression, major, single episode, severe (HCC)    Chronic, waxing and waning. Has difficulty with medication compliance (prozac and wellbutrin) Denies need for medication at this time. Agreed to psychology referral       Other Visit Diagnoses    Encounter for preventative adult health care exam with abnormal findings    -  Primary   Relevant Orders   CBC with Differential/Platelet   Comprehensive metabolic panel   Lipid panel   TSH   Cytology - PAP(  [place])   Encounter for lipid screening for cardiovascular disease       Relevant Orders   Lipid panel   Encounter for Papanicolaou smear for cervical cancer screening       Relevant Orders   Cytology - PAP( [place])   Screen for STD (sexually transmitted disease)       Relevant Orders   HIV antibody (with reflex)   RPR   Cervicovaginal ancillary only( [place])   Hepatitis C Antibody      Follow up: Return in about 1 year (around 11/24/2021) for CPE  (fasting).  Alysia Pennaharlotte Danta Baumgardner, NP

## 2020-11-24 NOTE — Patient Instructions (Signed)
Schedule fasting lab appt. You need to be fasting 6-8hrs prior to blood draw. Ok to drink water. You will be contacted to schedule appt with psychology.   Preventive Care 18-37 Years Old, Female Preventive care refers to lifestyle choices and visits with your health care provider that can promote health and wellness. This includes:  A yearly physical exam. This is also called an annual wellness visit.  Regular dental and eye exams.  Immunizations.  Screening for certain conditions.  Healthy lifestyle choices, such as: ? Eating a healthy diet. ? Getting regular exercise. ? Not using drugs or products that contain nicotine and tobacco. ? Limiting alcohol use. What can I expect for my preventive care visit? Physical exam Your health care provider may check your:  Height and weight. These may be used to calculate your BMI (body mass index). BMI is a measurement that tells if you are at a healthy weight.  Heart rate and blood pressure.  Body temperature.  Skin for abnormal spots. Counseling Your health care provider may ask you questions about your:  Past medical problems.  Family's medical history.  Alcohol, tobacco, and drug use.  Emotional well-being.  Home life and relationship well-being.  Sexual activity.  Diet, exercise, and sleep habits.  Work and work Statistician.  Access to firearms.  Method of birth control.  Menstrual cycle.  Pregnancy history. What immunizations do I need? Vaccines are usually given at various ages, according to a schedule. Your health care provider will recommend vaccines for you based on your age, medical history, and lifestyle or other factors, such as travel or where you work.   What tests do I need? Blood tests  Lipid and cholesterol levels. These may be checked every 5 years starting at age 17.  Hepatitis C test.  Hepatitis B test. Screening  Diabetes screening. This is done by checking your blood sugar (glucose)  after you have not eaten for a while (fasting).  STD (sexually transmitted disease) testing, if you are at risk.  BRCA-related cancer screening. This may be done if you have a family history of breast, ovarian, tubal, or peritoneal cancers.  Pelvic exam and Pap test. This may be done every 3 years starting at age 56. Starting at age 1, this may be done every 5 years if you have a Pap test in combination with an HPV test. Talk with your health care provider about your test results, treatment options, and if necessary, the need for more tests.   Follow these instructions at home: Eating and drinking  Eat a healthy diet that includes fresh fruits and vegetables, whole grains, lean protein, and low-fat dairy products.  Take vitamin and mineral supplements as recommended by your health care provider.  Do not drink alcohol if: ? Your health care provider tells you not to drink. ? You are pregnant, may be pregnant, or are planning to become pregnant.  If you drink alcohol: ? Limit how much you have to 0-1 drink a day. ? Be aware of how much alcohol is in your drink. In the [place] one drink equals one 12 oz bottle of beer (355 mL), one 5 oz glass of wine (148 mL), or one 1 oz glass of hard liquor (44 mL).   Lifestyle  Take daily care of your teeth and gums. Brush your teeth every morning and night with fluoride toothpaste. Floss one time each day.  Stay active. Exercise for at least 30 minutes 5 or more days each week.  Do  not use any products that contain nicotine or tobacco, such as cigarettes, e-cigarettes, and chewing tobacco. If you need help quitting, ask your health care provider.  Do not use drugs.  If you are sexually active, practice safe sex. Use a condom or other form of protection to prevent STIs (sexually transmitted infections).  If you do not wish to become pregnant, use a form of birth control. If you plan to become pregnant, see your health care provider for a  prepregnancy visit.  Find healthy ways to cope with stress, such as: ? Meditation, yoga, or listening to music. ? Journaling. ? Talking to a trusted person. ? Spending time with friends and family. Safety  Always wear your seat belt while driving or riding in a vehicle.  Do not drive: ? If you have been drinking alcohol. Do not ride with someone who has been drinking. ? When you are tired or distracted. ? While texting.  Wear a helmet and other protective equipment during sports activities.  If you have firearms in your house, make sure you follow all gun safety procedures.  Seek help if you have been physically or sexually abused. What's next?  Go to your health care provider once a year for an annual wellness visit.  Ask your health care provider how often you should have your eyes and teeth checked.  Stay up to date on all vaccines. This information is not intended to replace advice given to you by your health care provider. Make sure you discuss any questions you have with your health care provider. Document Revised: 03/20/2020 Document Reviewed: 04/03/2018 Elsevier Patient Education  2021 Elsevier Inc.  

## 2020-11-24 NOTE — Assessment & Plan Note (Signed)
Chronic, waxing and waning. Has difficulty with medication compliance (prozac and wellbutrin) Denies need for medication at this time. Agreed to psychology referral

## 2020-11-25 ENCOUNTER — Other Ambulatory Visit: Payer: BC Managed Care – PPO

## 2020-11-25 NOTE — Addendum Note (Signed)
Addended by: Varney Biles on: 11/25/2020 08:51 AM   Modules accepted: Orders

## 2020-11-28 ENCOUNTER — Telehealth: Payer: Self-pay

## 2020-11-28 LAB — CERVICOVAGINAL ANCILLARY ONLY
Bacterial Vaginitis (gardnerella): POSITIVE — AB
Candida Glabrata: NEGATIVE
Candida Vaginitis: NEGATIVE
Chlamydia: NEGATIVE
Comment: NEGATIVE
Comment: NEGATIVE
Comment: NEGATIVE
Comment: NEGATIVE
Comment: NEGATIVE
Comment: NORMAL
Neisseria Gonorrhea: NEGATIVE
Trichomonas: NEGATIVE

## 2020-11-28 LAB — CYTOLOGY - PAP
Comment: NEGATIVE
Diagnosis: NEGATIVE
High risk HPV: NEGATIVE

## 2020-11-28 MED ORDER — METRONIDAZOLE 0.75 % VA GEL: 1.0000 | Freq: Every day | VAGINAL | 0 refills | Status: DC

## 2020-11-28 NOTE — Addendum Note (Signed)
Addended by: Michaela Corner on: 11/28/2020 04:02 PM   Modules accepted: Orders

## 2020-11-28 NOTE — Telephone Encounter (Signed)
Pt got a call from her pharmacy that metroNIDAZOLE (METROGEL VAGINAL) 0.75 % vaginal gel  Was ready for pickup.  She said she cannot use this. She had it before and had side effects. She has used the suppositories.  If these can be sent in, send to [facility] not [facility]

## 2020-11-30 NOTE — Telephone Encounter (Signed)
I am not awre of a suppository formula. What side effects does she have with gel?

## 2020-11-30 NOTE — Telephone Encounter (Signed)
Pt states she was asking for a boric acid suppository because that works best for her. Pt states she also can only get the suppository from [facility] [place].

## 2020-11-30 NOTE — Telephone Encounter (Signed)
Boric acid is not used to treat BV. It is recommended after metronidazole treatment has been completed to prevent reoccurrence. Metronidazole gel can be switched to tablet. If she chooses to use only boric acid, she can buy it over the counter. A prescription is not needed.

## 2020-12-02 NOTE — Telephone Encounter (Signed)
Pt states she can not tolerate any form of the metronidazole and verbalized understanding of other information given.

## 2020-12-12 ENCOUNTER — Encounter: Payer: Self-pay | Admitting: Nurse Practitioner

## 2020-12-12 DIAGNOSIS — N760 Acute vaginitis: Secondary | ICD-10-CM

## 2020-12-12 MED ORDER — FLUCONAZOLE 150 MG PO TABS: 150.0000 mg | ORAL_TABLET | Freq: Every day | ORAL | 0 refills | Status: DC

## 2020-12-13 ENCOUNTER — Other Ambulatory Visit: Payer: BC Managed Care – PPO

## 2020-12-13 ENCOUNTER — Other Ambulatory Visit: Payer: Self-pay

## 2020-12-14 ENCOUNTER — Other Ambulatory Visit (HOSPITAL_COMMUNITY)
Admission: RE | Admit: 2020-12-14 | Discharge: 2020-12-14 | Disposition: A | Discharge status: Home / Self Care | Payer: BC Managed Care – PPO | Source: Ambulatory Visit | Attending: Nurse Practitioner | Admitting: Nurse Practitioner

## 2020-12-14 ENCOUNTER — Other Ambulatory Visit (INDEPENDENT_AMBULATORY_CARE_PROVIDER_SITE_OTHER): Payer: BC Managed Care – PPO

## 2020-12-14 ENCOUNTER — Other Ambulatory Visit: Payer: Self-pay

## 2020-12-14 DIAGNOSIS — N760 Acute vaginitis: Secondary | ICD-10-CM | POA: Insufficient documentation

## 2020-12-14 NOTE — Progress Notes (Signed)
Per orders of NP Alysia Penna pt is here to provide a urine sample, pt was able to provide adequate amount.

## 2020-12-19 LAB — URINE CYTOLOGY ANCILLARY ONLY
Chlamydia: NEGATIVE
Comment: NEGATIVE
Comment: NEGATIVE
Comment: NORMAL
Neisseria Gonorrhea: NEGATIVE
Trichomonas: NEGATIVE

## 2021-11-21 DIAGNOSIS — Z720 Tobacco use: Secondary | ICD-10-CM | POA: Diagnosis not present

## 2021-11-21 DIAGNOSIS — E785 Hyperlipidemia, unspecified: Secondary | ICD-10-CM | POA: Diagnosis not present

## 2021-11-21 DIAGNOSIS — E669 Obesity, unspecified: Secondary | ICD-10-CM | POA: Diagnosis not present

## 2021-11-21 DIAGNOSIS — Z0000 Encounter for general adult medical examination without abnormal findings: Secondary | ICD-10-CM | POA: Diagnosis not present

## 2021-11-21 DIAGNOSIS — Z131 Encounter for screening for diabetes mellitus: Secondary | ICD-10-CM | POA: Diagnosis not present

## 2021-11-28 ENCOUNTER — Encounter: Payer: BC Managed Care – PPO | Admitting: Nurse Practitioner

## 2021-12-12 DIAGNOSIS — Z720 Tobacco use: Secondary | ICD-10-CM | POA: Diagnosis not present

## 2021-12-12 DIAGNOSIS — E669 Obesity, unspecified: Secondary | ICD-10-CM | POA: Diagnosis not present

## 2021-12-12 DIAGNOSIS — E785 Hyperlipidemia, unspecified: Secondary | ICD-10-CM | POA: Diagnosis not present

## 2022-01-24 ENCOUNTER — Encounter: Payer: BC Managed Care – PPO | Admitting: Nurse Practitioner

## 2022-08-29 ENCOUNTER — Encounter: Payer: Self-pay | Admitting: Family Medicine

## 2022-08-29 ENCOUNTER — Ambulatory Visit: Payer: BC Managed Care – PPO | Admitting: Family Medicine

## 2022-08-29 VITALS — BP 136/82 | HR 79 | Temp 97.7°F | Ht 65.0 in | Wt 242.0 lb

## 2022-08-29 DIAGNOSIS — B029 Zoster without complications: Secondary | ICD-10-CM

## 2022-08-29 MED ORDER — GABAPENTIN 300 MG PO CAPS: ORAL_CAPSULE | ORAL | 0 refills | Status: AC

## 2022-08-29 NOTE — Patient Instructions (Signed)

## 2022-08-29 NOTE — Progress Notes (Signed)
[facility] [address] [place] [address] Dept: [phone] Dept Fax: [phone]  Office Visit  Subjective:    Patient ID: Angela Schwartz, female    DOB: 28-Dec-1985, 37 y.o..   MRN: [id number]  Chief Complaint  Patient presents with   Acute Visit    C/o having rash on back (Shingles) x 1 week.     History of Present Illness:  Patient is in today for evaluation of a rash on her back. She believes this to be shingles. She notes the rash developed last Thursday. She noted some new lesions in the past few days. This has been intensely itchy. She has had shingles several times in her adulthood. She has tried Benadryl and a cortisone cream on the site, but this has not helped the itch. She is not having any pain currently. She notes her insurance would not cover Shingrix vaccination for her.  Past Medical History: Patient Active Problem List   Diagnosis Date Noted   Patellofemoral pain syndrome 03/24/2019   Hx of herpes genitalis 07/09/2018   Atopic dermatitis 02/04/2018   Depression, major, single episode, severe ([place]) 10/15/2017   Past Surgical History:  Procedure Laterality Date   CESAREAN SECTION     MOUTH SURGERY     Family History  Problem Relation Age of Onset   Hypertension Mother    Hyperlipidemia Mother    Hypertension Father    Hyperlipidemia Father    Heart failure Father    Diabetes Father    Hypertension Brother    Cancer Maternal Aunt        cervical   Outpatient Medications Prior to Visit  Medication Sig Dispense Refill   levonorgestrel (MIRENA) 20 MCG/24HR IUD 1 each by Intrauterine route once.     fluconazole (DIFLUCAN) 150 MG tablet Take 1 tablet (150 mg total) by mouth daily. Take second tab 3days apart from first tab 2 tablet 0   Ibuprofen-Famotidine 800-26.6 MG TABS Take 1 tablet by mouth 3 (three) times daily. 90 tablet 3   metroNIDAZOLE (METROGEL VAGINAL) 0.75 % vaginal gel Place  1 Applicatorful vaginally at bedtime. 70 g 0   valACYclovir (VALTREX) 1000 MG tablet Take 1 tablet (1,000 mg total) by mouth 3 (three) times daily. 21 tablet 0   No facility-administered medications prior to visit.   Allergies  Allergen Reactions   Diclofenac Rash    Break out    Objective:   Today's Vitals   08/29/22 1347  BP: 136/82  Pulse: 79  Temp: 97.7 F (36.5 C)  TempSrc: Temporal  SpO2: 97%  Weight: 242 lb (109.8 kg)  Height: 5\' 5"  (1.651 m)   Body mass index is 40.27 kg/m.   General: Well developed, well nourished. No acute distress. Skin: Warm and dry. There is a cluster of intact vesicles and other crusted lesions in an area tot he right of the   upper thoracic spine. Psych: Alert and oriented. Normal mood and affect.  Health Maintenance Due  Topic Date Due   DTaP/Tdap/Td (6 - Tdap) 02/23/1995   HIV Screening  Never done   Hepatitis C Screening  Never done     Assessment & Plan:   1. Herpes zoster without complication We discussed that the itching is likely directly from nerve irritation rather than an allergic cause. I will plan to treat her with gabapentin to see if we can calm this down. She is too late for prescribing of an antiviral. She iwll  follow-up with Ms. Nche as needed.  - gabapentin (NEURONTIN) 300 MG capsule; Take 1 capsule (300 mg total) by mouth at bedtime for 1 day, THEN 1 capsule (300 mg total) 2 (two) times daily for 1 day, THEN 1 capsule (300 mg total) 3 (three) times daily for 12 days.  Dispense: 39 capsule; Refill: 0  Return if symptoms worsen or fail to improve.   Haydee Salter, MD

## 2022-11-19 ENCOUNTER — Encounter: Payer: Self-pay | Admitting: *Deleted

## 2024-04-10 ENCOUNTER — Encounter: Admitting: Nurse Practitioner

## 2024-06-09 ENCOUNTER — Ambulatory Visit: Payer: Self-pay | Admitting: Nurse Practitioner
